# Patient Record
Sex: Female | Born: 1955 | Race: White | Hispanic: No | Marital: Married | Smoking: Former smoker
Health system: Southern US, Community
[De-identification: ages and names within clinical notes are randomized; demographics above are authoritative.]

## PROBLEM LIST (undated history)

## (undated) DIAGNOSIS — R0602 Shortness of breath: Secondary | ICD-10-CM

## (undated) DIAGNOSIS — K5900 Constipation, unspecified: Secondary | ICD-10-CM

## (undated) DIAGNOSIS — K5790 Diverticulosis of intestine, part unspecified, without perforation or abscess without bleeding: Secondary | ICD-10-CM

## (undated) DIAGNOSIS — R4189 Other symptoms and signs involving cognitive functions and awareness: Secondary | ICD-10-CM

## (undated) DIAGNOSIS — Z87898 Personal history of other specified conditions: Secondary | ICD-10-CM

## (undated) DIAGNOSIS — R5383 Other fatigue: Secondary | ICD-10-CM

## (undated) DIAGNOSIS — L749 Eccrine sweat disorder, unspecified: Secondary | ICD-10-CM

## (undated) DIAGNOSIS — I340 Nonrheumatic mitral (valve) insufficiency: Secondary | ICD-10-CM

## (undated) DIAGNOSIS — K5909 Other constipation: Secondary | ICD-10-CM

## (undated) DIAGNOSIS — F988 Other specified behavioral and emotional disorders with onset usually occurring in childhood and adolescence: Secondary | ICD-10-CM

## (undated) DIAGNOSIS — I351 Nonrheumatic aortic (valve) insufficiency: Secondary | ICD-10-CM

## (undated) DIAGNOSIS — R911 Solitary pulmonary nodule: Secondary | ICD-10-CM

## (undated) DIAGNOSIS — C4491 Basal cell carcinoma of skin, unspecified: Secondary | ICD-10-CM

## (undated) DIAGNOSIS — E039 Hypothyroidism, unspecified: Secondary | ICD-10-CM

## (undated) DIAGNOSIS — F909 Attention-deficit hyperactivity disorder, unspecified type: Secondary | ICD-10-CM

## (undated) DIAGNOSIS — E079 Disorder of thyroid, unspecified: Secondary | ICD-10-CM

## (undated) DIAGNOSIS — H409 Unspecified glaucoma: Secondary | ICD-10-CM

## (undated) DIAGNOSIS — J45909 Unspecified asthma, uncomplicated: Secondary | ICD-10-CM

## (undated) DIAGNOSIS — J302 Other seasonal allergic rhinitis: Secondary | ICD-10-CM

## (undated) DIAGNOSIS — I700 Atherosclerosis of aorta: Secondary | ICD-10-CM

## (undated) DIAGNOSIS — T7840XA Allergy, unspecified, initial encounter: Secondary | ICD-10-CM

## (undated) DIAGNOSIS — K635 Polyp of colon: Secondary | ICD-10-CM

## (undated) DIAGNOSIS — Z860101 Personal history of adenomatous and serrated colon polyps: Secondary | ICD-10-CM

## (undated) DIAGNOSIS — N200 Calculus of kidney: Secondary | ICD-10-CM

## (undated) DIAGNOSIS — M1990 Unspecified osteoarthritis, unspecified site: Secondary | ICD-10-CM

## (undated) HISTORY — DX: Shortness of breath: R06.02

## (undated) HISTORY — PX: OOPHORECTOMY: SHX86

## (undated) HISTORY — DX: Other constipation: K59.09

## (undated) HISTORY — DX: Basal cell carcinoma of skin, unspecified: C44.91

## (undated) HISTORY — PX: BASAL CELL CARCINOMA EXCISION: SHX1214

## (undated) HISTORY — DX: Unspecified osteoarthritis, unspecified site: M19.90

## (undated) HISTORY — DX: Nonrheumatic aortic (valve) insufficiency: I35.1

## (undated) HISTORY — DX: Polyp of colon: K63.5

## (undated) HISTORY — DX: Unspecified asthma, uncomplicated: J45.909

## (undated) HISTORY — DX: Eccrine sweat disorder, unspecified: L74.9

## (undated) HISTORY — DX: Attention-deficit hyperactivity disorder, unspecified type: F90.9

## (undated) HISTORY — DX: Calculus of kidney: N20.0

## (undated) HISTORY — DX: Other specified behavioral and emotional disorders with onset usually occurring in childhood and adolescence: F98.8

## (undated) HISTORY — PX: REPLACEMENT TOTAL KNEE: SUR1224

## (undated) HISTORY — DX: Personal history of other specified conditions: Z87.898

## (undated) HISTORY — DX: Other fatigue: R53.83

## (undated) HISTORY — DX: Diverticulosis of intestine, part unspecified, without perforation or abscess without bleeding: K57.90

## (undated) HISTORY — DX: Other seasonal allergic rhinitis: J30.2

## (undated) HISTORY — DX: Nonrheumatic mitral (valve) insufficiency: I34.0

## (undated) HISTORY — DX: Disorder of thyroid, unspecified: E07.9

## (undated) HISTORY — DX: Allergy, unspecified, initial encounter: T78.40XA

## (undated) HISTORY — DX: Other symptoms and signs involving cognitive functions and awareness: R41.89

## (undated) HISTORY — DX: Atherosclerosis of aorta: I70.0

## (undated) HISTORY — DX: Constipation, unspecified: K59.00

## (undated) HISTORY — DX: Solitary pulmonary nodule: R91.1

## (undated) HISTORY — DX: Unspecified glaucoma: H40.9

## (undated) HISTORY — DX: Hypothyroidism, unspecified: E03.9

## (undated) HISTORY — DX: Personal history of adenomatous and serrated colon polyps: Z86.0101

## (undated) HISTORY — PX: PELVIC LAPAROSCOPY: SHX162

## (undated) HISTORY — PX: KNEE SURGERY: SHX244

---

## 1994-09-19 HISTORY — PX: VAGINAL HYSTERECTOMY: SUR661

## 1998-02-27 ENCOUNTER — Other Ambulatory Visit: Admission: RE | Admit: 1998-02-27 | Discharge: 1998-02-27 | Payer: Self-pay | Admitting: Obstetrics and Gynecology

## 1999-02-16 ENCOUNTER — Encounter: Payer: Self-pay | Admitting: Obstetrics and Gynecology

## 1999-02-16 ENCOUNTER — Ambulatory Visit (HOSPITAL_COMMUNITY): Admission: RE | Admit: 1999-02-16 | Discharge: 1999-02-16 | Payer: Self-pay | Admitting: Family Medicine

## 1999-03-31 ENCOUNTER — Other Ambulatory Visit: Admission: RE | Admit: 1999-03-31 | Discharge: 1999-03-31 | Payer: Self-pay | Admitting: Obstetrics and Gynecology

## 2000-03-31 ENCOUNTER — Other Ambulatory Visit: Admission: RE | Admit: 2000-03-31 | Discharge: 2000-03-31 | Payer: Self-pay | Admitting: Obstetrics and Gynecology

## 2000-08-14 ENCOUNTER — Ambulatory Visit (HOSPITAL_COMMUNITY): Admission: RE | Admit: 2000-08-14 | Discharge: 2000-08-14 | Payer: Self-pay | Admitting: Obstetrics and Gynecology

## 2000-08-14 ENCOUNTER — Encounter: Payer: Self-pay | Admitting: Obstetrics and Gynecology

## 2001-04-09 ENCOUNTER — Other Ambulatory Visit: Admission: RE | Admit: 2001-04-09 | Discharge: 2001-04-09 | Payer: Self-pay | Admitting: Obstetrics and Gynecology

## 2001-10-06 ENCOUNTER — Emergency Department (HOSPITAL_COMMUNITY): Admission: EM | Admit: 2001-10-06 | Discharge: 2001-10-06 | Payer: Self-pay

## 2002-04-11 ENCOUNTER — Other Ambulatory Visit: Admission: RE | Admit: 2002-04-11 | Discharge: 2002-04-11 | Payer: Self-pay | Admitting: Obstetrics and Gynecology

## 2002-11-21 ENCOUNTER — Encounter: Admission: RE | Admit: 2002-11-21 | Discharge: 2002-11-21 | Payer: Self-pay | Admitting: Family Medicine

## 2002-11-21 ENCOUNTER — Encounter: Payer: Self-pay | Admitting: Family Medicine

## 2003-01-22 ENCOUNTER — Encounter: Admission: RE | Admit: 2003-01-22 | Discharge: 2003-01-22 | Payer: Self-pay | Admitting: Otolaryngology

## 2003-01-22 ENCOUNTER — Encounter: Payer: Self-pay | Admitting: Otolaryngology

## 2003-04-17 ENCOUNTER — Ambulatory Visit (HOSPITAL_COMMUNITY): Admission: RE | Admit: 2003-04-17 | Discharge: 2003-04-17 | Payer: Self-pay | Admitting: Obstetrics and Gynecology

## 2003-04-17 ENCOUNTER — Encounter: Payer: Self-pay | Admitting: Obstetrics and Gynecology

## 2004-04-14 ENCOUNTER — Other Ambulatory Visit: Admission: RE | Admit: 2004-04-14 | Discharge: 2004-04-14 | Payer: Self-pay | Admitting: Obstetrics and Gynecology

## 2004-09-19 HISTORY — PX: OTHER SURGICAL HISTORY: SHX169

## 2004-11-29 ENCOUNTER — Ambulatory Visit (HOSPITAL_COMMUNITY): Admission: RE | Admit: 2004-11-29 | Discharge: 2004-11-29 | Payer: Self-pay | Admitting: Obstetrics and Gynecology

## 2005-04-19 ENCOUNTER — Other Ambulatory Visit: Admission: RE | Admit: 2005-04-19 | Discharge: 2005-04-19 | Payer: Self-pay | Admitting: Obstetrics and Gynecology

## 2005-08-08 ENCOUNTER — Ambulatory Visit (HOSPITAL_COMMUNITY): Admission: RE | Admit: 2005-08-08 | Discharge: 2005-08-08 | Payer: Self-pay | Admitting: Otolaryngology

## 2006-01-06 ENCOUNTER — Ambulatory Visit: Payer: Self-pay | Admitting: Internal Medicine

## 2006-01-31 ENCOUNTER — Encounter (INDEPENDENT_AMBULATORY_CARE_PROVIDER_SITE_OTHER): Payer: Self-pay | Admitting: Specialist

## 2006-01-31 ENCOUNTER — Ambulatory Visit: Payer: Self-pay | Admitting: Internal Medicine

## 2006-04-25 ENCOUNTER — Other Ambulatory Visit: Admission: RE | Admit: 2006-04-25 | Discharge: 2006-04-25 | Payer: Self-pay | Admitting: Obstetrics and Gynecology

## 2007-04-05 ENCOUNTER — Ambulatory Visit (HOSPITAL_COMMUNITY): Admission: RE | Admit: 2007-04-05 | Discharge: 2007-04-05 | Payer: Self-pay | Admitting: Obstetrics and Gynecology

## 2007-05-01 ENCOUNTER — Other Ambulatory Visit: Admission: RE | Admit: 2007-05-01 | Discharge: 2007-05-01 | Payer: Self-pay | Admitting: Obstetrics and Gynecology

## 2008-01-29 ENCOUNTER — Encounter: Payer: Self-pay | Admitting: Internal Medicine

## 2008-01-29 ENCOUNTER — Telehealth: Payer: Self-pay | Admitting: Internal Medicine

## 2008-01-31 DIAGNOSIS — D126 Benign neoplasm of colon, unspecified: Secondary | ICD-10-CM

## 2008-01-31 DIAGNOSIS — E039 Hypothyroidism, unspecified: Secondary | ICD-10-CM | POA: Insufficient documentation

## 2008-01-31 DIAGNOSIS — F411 Generalized anxiety disorder: Secondary | ICD-10-CM | POA: Insufficient documentation

## 2008-02-01 ENCOUNTER — Ambulatory Visit: Payer: Self-pay | Admitting: Cardiology

## 2008-02-01 ENCOUNTER — Ambulatory Visit: Payer: Self-pay | Admitting: Internal Medicine

## 2008-02-01 DIAGNOSIS — N809 Endometriosis, unspecified: Secondary | ICD-10-CM | POA: Insufficient documentation

## 2008-02-01 DIAGNOSIS — K5010 Crohn's disease of large intestine without complications: Secondary | ICD-10-CM | POA: Insufficient documentation

## 2008-02-01 DIAGNOSIS — R1031 Right lower quadrant pain: Secondary | ICD-10-CM

## 2008-02-04 ENCOUNTER — Telehealth: Payer: Self-pay | Admitting: Internal Medicine

## 2008-05-01 ENCOUNTER — Other Ambulatory Visit: Admission: RE | Admit: 2008-05-01 | Discharge: 2008-05-01 | Payer: Self-pay | Admitting: Obstetrics and Gynecology

## 2009-04-22 ENCOUNTER — Ambulatory Visit (HOSPITAL_COMMUNITY): Admission: RE | Admit: 2009-04-22 | Discharge: 2009-04-22 | Payer: Self-pay | Admitting: Obstetrics and Gynecology

## 2009-05-04 ENCOUNTER — Encounter: Payer: Self-pay | Admitting: Obstetrics and Gynecology

## 2009-05-04 ENCOUNTER — Other Ambulatory Visit: Admission: RE | Admit: 2009-05-04 | Discharge: 2009-05-04 | Payer: Self-pay | Admitting: Obstetrics and Gynecology

## 2009-05-04 ENCOUNTER — Ambulatory Visit: Payer: Self-pay | Admitting: Obstetrics and Gynecology

## 2009-10-05 ENCOUNTER — Ambulatory Visit: Payer: Self-pay | Admitting: Obstetrics and Gynecology

## 2010-05-12 ENCOUNTER — Ambulatory Visit: Payer: Self-pay | Admitting: Obstetrics and Gynecology

## 2010-12-08 ENCOUNTER — Ambulatory Visit (INDEPENDENT_AMBULATORY_CARE_PROVIDER_SITE_OTHER): Payer: BC Managed Care – PPO | Admitting: Obstetrics and Gynecology

## 2010-12-08 DIAGNOSIS — R635 Abnormal weight gain: Secondary | ICD-10-CM

## 2010-12-08 DIAGNOSIS — L680 Hirsutism: Secondary | ICD-10-CM

## 2011-02-02 ENCOUNTER — Other Ambulatory Visit: Payer: Self-pay | Admitting: Obstetrics and Gynecology

## 2011-02-02 DIAGNOSIS — Z1231 Encounter for screening mammogram for malignant neoplasm of breast: Secondary | ICD-10-CM

## 2011-02-08 ENCOUNTER — Encounter: Payer: Self-pay | Admitting: Internal Medicine

## 2011-02-16 ENCOUNTER — Ambulatory Visit: Payer: BC Managed Care – PPO | Admitting: Obstetrics and Gynecology

## 2011-02-16 ENCOUNTER — Ambulatory Visit (HOSPITAL_COMMUNITY)
Admission: RE | Admit: 2011-02-16 | Discharge: 2011-02-16 | Disposition: A | Discharge status: Home / Self Care | Payer: BC Managed Care – PPO | Source: Ambulatory Visit | Attending: Obstetrics and Gynecology | Admitting: Obstetrics and Gynecology

## 2011-02-16 DIAGNOSIS — Z1231 Encounter for screening mammogram for malignant neoplasm of breast: Secondary | ICD-10-CM | POA: Insufficient documentation

## 2011-03-01 ENCOUNTER — Ambulatory Visit (INDEPENDENT_AMBULATORY_CARE_PROVIDER_SITE_OTHER): Payer: BC Managed Care – PPO | Admitting: Obstetrics and Gynecology

## 2011-03-01 DIAGNOSIS — N898 Other specified noninflammatory disorders of vagina: Secondary | ICD-10-CM

## 2011-03-01 DIAGNOSIS — B373 Candidiasis of vulva and vagina: Secondary | ICD-10-CM

## 2011-03-01 DIAGNOSIS — E039 Hypothyroidism, unspecified: Secondary | ICD-10-CM

## 2011-04-11 ENCOUNTER — Other Ambulatory Visit: Payer: Self-pay | Admitting: *Deleted

## 2011-04-11 ENCOUNTER — Other Ambulatory Visit: Payer: Self-pay | Admitting: Sports Medicine

## 2011-04-11 DIAGNOSIS — M7120 Synovial cyst of popliteal space [Baker], unspecified knee: Secondary | ICD-10-CM

## 2011-04-11 MED ORDER — THYROID 60 MG PO TABS: 60.0000 mg | ORAL_TABLET | Freq: Every day | ORAL | Status: DC

## 2011-04-11 NOTE — Telephone Encounter (Signed)
Received refill fax.  Please authorize refills. Will be e-scribed.

## 2011-04-12 ENCOUNTER — Other Ambulatory Visit: Payer: Self-pay | Admitting: Sports Medicine

## 2011-04-12 ENCOUNTER — Ambulatory Visit
Admission: RE | Admit: 2011-04-12 | Discharge: 2011-04-12 | Disposition: A | Discharge status: Home / Self Care | Payer: BC Managed Care – PPO | Source: Ambulatory Visit | Attending: Sports Medicine | Admitting: Sports Medicine

## 2011-04-12 DIAGNOSIS — M7120 Synovial cyst of popliteal space [Baker], unspecified knee: Secondary | ICD-10-CM

## 2011-04-27 ENCOUNTER — Other Ambulatory Visit: Payer: Self-pay | Admitting: Dermatology

## 2011-04-29 ENCOUNTER — Encounter: Payer: Self-pay | Admitting: Gynecology

## 2011-05-15 ENCOUNTER — Other Ambulatory Visit: Payer: Self-pay | Admitting: Obstetrics and Gynecology

## 2011-05-16 ENCOUNTER — Other Ambulatory Visit (HOSPITAL_COMMUNITY)
Admission: RE | Admit: 2011-05-16 | Discharge: 2011-05-16 | Disposition: A | Discharge status: Home / Self Care | Payer: BC Managed Care – PPO | Source: Ambulatory Visit | Attending: Obstetrics and Gynecology | Admitting: Obstetrics and Gynecology

## 2011-05-16 ENCOUNTER — Ambulatory Visit (INDEPENDENT_AMBULATORY_CARE_PROVIDER_SITE_OTHER): Payer: BC Managed Care – PPO | Admitting: Obstetrics and Gynecology

## 2011-05-16 ENCOUNTER — Encounter: Payer: Self-pay | Admitting: Obstetrics and Gynecology

## 2011-05-16 VITALS — BP 116/74 | Ht 66.0 in | Wt 162.0 lb

## 2011-05-16 DIAGNOSIS — B373 Candidiasis of vulva and vagina: Secondary | ICD-10-CM

## 2011-05-16 DIAGNOSIS — Z01419 Encounter for gynecological examination (general) (routine) without abnormal findings: Secondary | ICD-10-CM | POA: Insufficient documentation

## 2011-05-16 DIAGNOSIS — E039 Hypothyroidism, unspecified: Secondary | ICD-10-CM

## 2011-05-16 DIAGNOSIS — N951 Menopausal and female climacteric states: Secondary | ICD-10-CM

## 2011-05-16 DIAGNOSIS — Z780 Asymptomatic menopausal state: Secondary | ICD-10-CM

## 2011-05-16 DIAGNOSIS — N898 Other specified noninflammatory disorders of vagina: Secondary | ICD-10-CM

## 2011-05-16 MED ORDER — THYROID 90 MG PO TABS: 90.0000 mg | ORAL_TABLET | Freq: Every day | ORAL | Status: DC

## 2011-05-16 MED ORDER — ESTRADIOL 0.05 MG/24HR TD PTWK: 1.0000 | MEDICATED_PATCH | TRANSDERMAL | Status: DC

## 2011-05-16 MED ORDER — FLUCONAZOLE 200 MG PO TABS: 200.0000 mg | ORAL_TABLET | Freq: Every day | ORAL | Status: DC

## 2011-05-16 NOTE — Progress Notes (Signed)
Patient came to see me today for her annual GYN exam. She is doing very well on her patch even though we lowered her dose. She's also doing much better on Armour thyroid than she did on Synthroid. She is up-to-date on mammograms. She never had a bone density because her insurance will not pay. She has however lost some height since I last saw her. She is still having trouble with weight and brittlel nails and thinning hair. Her TSH in June was 3. She was previously bothered by yeast vaginitis. She responded well to a 7 day course of Diflucan. She thinks it's reoccurred.  HEENT: Within normal limits. Neck: No masses. Supraclavicular lymph nodes: Not enlarged. Breasts: Examined in both sitting and lying position. Symmetrical without skin changes or masses. Abdomen: Soft no masses guarding or rebound. No hernias. Pelvic: External within normal limits. BUS within normal limits. Vaginal examination shows good estrogen effect, no cystocele enterocele or rectocele. Positive KOH. Cervix and uterus absent. Adnexa within normal limits. Rectovaginal confirmatory. Extremities within normal limits.   Assessment: Hypothyroidism, menopausal symptoms, yeast vaginitis.  Plan: 1. Increased Armour Thyroid 90 mg daily. We check TSH in 8 weeks. 2 Diflucan 200 mg daily for 7 days 3. Continue on the 0.05 mg estrogen patch. 4. Patient to ccall her insurance company and tell them she's lost height and get approval for bone density to be done here.

## 2011-07-21 ENCOUNTER — Other Ambulatory Visit (INDEPENDENT_AMBULATORY_CARE_PROVIDER_SITE_OTHER): Payer: BC Managed Care – PPO | Admitting: *Deleted

## 2011-07-21 ENCOUNTER — Other Ambulatory Visit: Payer: Self-pay | Admitting: Obstetrics and Gynecology

## 2011-07-21 ENCOUNTER — Encounter (INDEPENDENT_AMBULATORY_CARE_PROVIDER_SITE_OTHER): Payer: BC Managed Care – PPO

## 2011-07-21 DIAGNOSIS — E039 Hypothyroidism, unspecified: Secondary | ICD-10-CM

## 2011-07-21 DIAGNOSIS — Z13820 Encounter for screening for osteoporosis: Secondary | ICD-10-CM

## 2011-09-06 ENCOUNTER — Other Ambulatory Visit: Payer: Self-pay | Admitting: Obstetrics and Gynecology

## 2011-09-11 ENCOUNTER — Other Ambulatory Visit: Payer: Self-pay | Admitting: Obstetrics and Gynecology

## 2012-05-16 ENCOUNTER — Encounter: Payer: Self-pay | Admitting: Obstetrics and Gynecology

## 2012-05-16 ENCOUNTER — Ambulatory Visit (INDEPENDENT_AMBULATORY_CARE_PROVIDER_SITE_OTHER): Payer: BC Managed Care – PPO | Admitting: Obstetrics and Gynecology

## 2012-05-16 VITALS — BP 124/78 | Ht 66.0 in | Wt 153.0 lb

## 2012-05-16 DIAGNOSIS — E039 Hypothyroidism, unspecified: Secondary | ICD-10-CM

## 2012-05-16 DIAGNOSIS — R87619 Unspecified abnormal cytological findings in specimens from cervix uteri: Secondary | ICD-10-CM

## 2012-05-16 DIAGNOSIS — Z01419 Encounter for gynecological examination (general) (routine) without abnormal findings: Secondary | ICD-10-CM

## 2012-05-16 DIAGNOSIS — Z833 Family history of diabetes mellitus: Secondary | ICD-10-CM

## 2012-05-16 DIAGNOSIS — H409 Unspecified glaucoma: Secondary | ICD-10-CM | POA: Insufficient documentation

## 2012-05-16 DIAGNOSIS — N898 Other specified noninflammatory disorders of vagina: Secondary | ICD-10-CM

## 2012-05-16 LAB — LIPID PANEL
Cholesterol: 174 mg/dL (ref 0–200)
LDL Cholesterol: 92 mg/dL (ref 0–99)
VLDL: 13 mg/dL (ref 0–40)

## 2012-05-16 LAB — CBC WITH DIFFERENTIAL/PLATELET
Basophils Absolute: 0.1 10*3/uL (ref 0.0–0.1)
Lymphocytes Relative: 46 % (ref 12–46)
Neutro Abs: 1.9 10*3/uL (ref 1.7–7.7)
Neutrophils Relative %: 42 % — ABNORMAL LOW (ref 43–77)
Platelets: 270 10*3/uL (ref 150–400)
RDW: 12.9 % (ref 11.5–15.5)
WBC: 4.7 10*3/uL (ref 4.0–10.5)

## 2012-05-16 LAB — HEMOGLOBIN A1C
Hgb A1c MFr Bld: 5.5 % (ref <5.7)
Mean Plasma Glucose: 111 mg/dL (ref <117)

## 2012-05-16 LAB — WET PREP FOR TRICH, YEAST, CLUE: Trich, Wet Prep: NONE SEEN

## 2012-05-16 MED ORDER — FLUCONAZOLE 200 MG PO TABS: 200.0000 mg | ORAL_TABLET | Freq: Every day | ORAL | Status: AC

## 2012-05-16 MED ORDER — LORAZEPAM 0.5 MG PO TABS: 0.5000 mg | ORAL_TABLET | ORAL | Status: DC | PRN

## 2012-05-16 MED ORDER — THYROID 90 MG PO TABS: 90.0000 mg | ORAL_TABLET | Freq: Every day | ORAL | Status: DC

## 2012-05-16 MED ORDER — CLIMARA 0.05 MG/24HR TD PTWK: 1.0000 | MEDICATED_PATCH | TRANSDERMAL | Status: DC

## 2012-05-16 NOTE — Progress Notes (Signed)
Patient came to see me today for her annual GYN exam. She thinks she has a vaginal yeast infection. She does not get discharge and itching usually but gets lower abdominal discomfort which she is currently having. She tried a one-day Monistat without success. She usually does well with Diflucan. She is having no vaginal bleeding. She is having no pelvic pain. She has been doing mammograms every other year and had a normal 1 in 2012. She had a normal bone density in 2012 as well. She has significant menopausal symptoms that respond well to her Climara patch. We are also treating her with Armour Thyroid 90 mg daily for hypothyroidism. She is doing well on it with no thyroid symptoms. She had a LAVH, BSO in 1996 for endometriosis. She has always had normal Pap smears. Her last Pap smear was 2012.  HEENT: Within normal limits. Kennon Portela present. Neck: No masses. Supraclavicular lymph nodes: Not enlarged. Breasts: Examined in both sitting and lying position. Symmetrical without skin changes or masses. Abdomen: Soft no masses guarding or rebound. No hernias. Pelvic: External within normal limits. BUS within normal limits. Vaginal examination shows good estrogen effect, no cystocele enterocele or rectocele. Patient has a yeast like discharge. Wet prep was negative except for clue cells. Cervix and uterus absent. Adnexa within normal limits. Rectovaginal confirmatory. Extremities within normal limits.  Assessment: #1. Menopausal symptoms #2. Yeast vaginitis #3. Hypothyroidism  Plan: Encourage patient to go to yearly mammograms. Appropriate lab work done. Continue Climara patch. Continue Armour Thyroid. Based on symptoms and exam treated with Diflucan 200 mg daily for 7 days. Discussed oral refresh.The new Pap smear guidelines were discussed with the patient. No Pap done.

## 2012-05-16 NOTE — Patient Instructions (Signed)
Call if lower abdominal discomfort continues.

## 2012-05-17 LAB — URINALYSIS W MICROSCOPIC + REFLEX CULTURE
Glucose, UA: NEGATIVE mg/dL
Hgb urine dipstick: NEGATIVE
Leukocytes, UA: NEGATIVE
Nitrite: NEGATIVE
Protein, ur: NEGATIVE mg/dL
pH: 7 (ref 5.0–8.0)

## 2012-06-05 ENCOUNTER — Other Ambulatory Visit: Payer: Self-pay | Admitting: Dermatology

## 2012-06-14 ENCOUNTER — Encounter: Payer: Self-pay | Admitting: Internal Medicine

## 2012-07-05 ENCOUNTER — Other Ambulatory Visit: Payer: Self-pay | Admitting: Dermatology

## 2013-04-18 ENCOUNTER — Other Ambulatory Visit: Payer: Self-pay | Admitting: Gynecology

## 2013-04-18 DIAGNOSIS — Z1231 Encounter for screening mammogram for malignant neoplasm of breast: Secondary | ICD-10-CM

## 2013-04-23 ENCOUNTER — Ambulatory Visit (HOSPITAL_COMMUNITY)
Admission: RE | Admit: 2013-04-23 | Discharge: 2013-04-23 | Disposition: A | Discharge status: Home / Self Care | Payer: BC Managed Care – PPO | Source: Ambulatory Visit | Attending: Gynecology | Admitting: Gynecology

## 2013-04-23 DIAGNOSIS — Z1231 Encounter for screening mammogram for malignant neoplasm of breast: Secondary | ICD-10-CM | POA: Insufficient documentation

## 2013-05-16 ENCOUNTER — Other Ambulatory Visit: Payer: Self-pay | Admitting: Obstetrics and Gynecology

## 2013-05-22 ENCOUNTER — Ambulatory Visit (INDEPENDENT_AMBULATORY_CARE_PROVIDER_SITE_OTHER): Payer: BC Managed Care – PPO | Admitting: Gynecology

## 2013-05-22 ENCOUNTER — Encounter: Payer: Self-pay | Admitting: Gynecology

## 2013-05-22 VITALS — BP 110/70 | Ht 66.25 in | Wt 160.0 lb

## 2013-05-22 DIAGNOSIS — Z01419 Encounter for gynecological examination (general) (routine) without abnormal findings: Secondary | ICD-10-CM

## 2013-05-22 DIAGNOSIS — Z79890 Hormone replacement therapy (postmenopausal): Secondary | ICD-10-CM

## 2013-05-22 DIAGNOSIS — Z13220 Encounter for screening for lipoid disorders: Secondary | ICD-10-CM

## 2013-05-22 DIAGNOSIS — E039 Hypothyroidism, unspecified: Secondary | ICD-10-CM

## 2013-05-22 LAB — LIPID PANEL
HDL: 77 mg/dL (ref >39)
LDL Cholesterol: 113 mg/dL — ABNORMAL HIGH (ref 0–99)
Triglycerides: 97 mg/dL (ref <150)

## 2013-05-22 LAB — COMPREHENSIVE METABOLIC PANEL
ALT: 27 U/L (ref 0–35)
Albumin: 4.8 g/dL (ref 3.5–5.2)
Alkaline Phosphatase: 59 U/L (ref 39–117)
Glucose, Bld: 89 mg/dL (ref 70–99)
Potassium: 4.4 mEq/L (ref 3.5–5.3)
Sodium: 140 mEq/L (ref 135–145)
Total Protein: 7 g/dL (ref 6.0–8.3)

## 2013-05-22 LAB — CBC WITH DIFFERENTIAL/PLATELET
Basophils Relative: 1 % (ref 0–1)
Hemoglobin: 14.6 g/dL (ref 12.0–15.0)
Lymphs Abs: 2.3 10*3/uL (ref 0.7–4.0)
MCHC: 34.4 g/dL (ref 30.0–36.0)
Monocytes Relative: 8 % (ref 3–12)
Neutro Abs: 1.9 10*3/uL (ref 1.7–7.7)
Neutrophils Relative %: 39 % — ABNORMAL LOW (ref 43–77)
RBC: 4.61 MIL/uL (ref 3.87–5.11)

## 2013-05-22 LAB — TSH: TSH: 1.837 u[IU]/mL (ref 0.350–4.500)

## 2013-05-22 MED ORDER — CLIMARA 0.05 MG/24HR TD PTWK: MEDICATED_PATCH | TRANSDERMAL | Status: DC

## 2013-05-22 MED ORDER — THYROID 90 MG PO TABS: 90.0000 mg | ORAL_TABLET | Freq: Every day | ORAL | Status: DC

## 2013-05-22 MED ORDER — FLUCONAZOLE 200 MG PO TABS: 200.0000 mg | ORAL_TABLET | Freq: Every day | ORAL | Status: DC

## 2013-05-22 NOTE — Progress Notes (Signed)
Elaine Gray [birth year] [id number]        57 y.o.  G2P0020 for annual exam.  Former patient of Dr. Eda Paschal with several issues noted below.  Past medical history,surgical history, medications, allergies, family history and social history were all reviewed and documented in the EPIC chart.  ROS:  Performed and pertinent positives and negatives are included in the history, assessment and plan .  Exam: Sherrilyn Rist assistant Filed Vitals:   05/22/13 1008  BP: 110/70  Height: 5' 6.25" (1.683 m)  Weight: 160 lb (72.576 kg)   General appearance  Normal Skin grossly normal Head/Neck normal with no cervical or supraclavicular adenopathy thyroid normal Lungs  clear Cardiac RR, without RMG Abdominal  soft, nontender, without masses, organomegaly or hernia Breasts  examined lying and sitting without masses, retractions, discharge or axillary adenopathy. Pelvic  Ext/BUS/vagina  normal  Adnexa  Without masses or tenderness    Anus and perineum  normal   Rectovaginal  normal sphincter tone without palpated masses or tenderness.    Assessment/Plan:  57 y.o. G34P0020 female for annual exam.   1. HRT.  Patient on Climara 0.05 mg patch status post LAVH BSO 1996 for endometriosis. She is doing well with this and wants to continue. She has missed in the past and had significant hot flushes and night sweats.  I reviewed the whole issue of HRT with her to include the WHI study with increased risk of stroke, heart attack, DVT and breast cancer. The ACOG and NAMS statements for lowest dose for the shortest period of time reviewed. Transdermal versus oral first-pass effect benefit discussed.  The patient understands and accepts the risks and I refilled her Climara 0.05 mg patch x1 year.  2. Recurrent yeast vulvovaginitis. Patient has 3-4 yeast episodes per year. Responds well to Diflucan. Diflucan 200 mg #8 tabs provided to use when necessary this coming year. She has tried the 150 mg and sometimes needs to  have these and this is why I used the 200 mg dose. 3. Hypothyroid. On Armour thyroid 90 mg per Dr. Eda Paschal. Was initiated over 20 years ago by her primary physician. Will check TSH today and I refilled her times a year. She is having some difficulty losing weight and we discussed possibly increasing her dose depending on her TSH. 4. Pap smear 2012. No Pap smear done today. Reviewed current screening guidelines. She is status post hysterectomy for benign indications. Has no history of abnormal Pap smears previously. Options to stop screening altogether versus less frequent screening intervals reviewed. We'll readdress on an annual basis. 5. Mammography 04/2013. We'll continue annual mammography. SBE monthly reviewed. 6. DEXA 2012 normal. Repeat a 5 year interval. Increase calcium vitamin D reviewed. 7. Colonoscopy 2007. She received a notice saying she was due and I have asked her to call and schedule this. 8. Health maintenance. Baseline CBC comprehensive metabolic panel lipid profile urinalysis TSH and vitamin D ordered. Followup in one year, sooner as needed.    Note: This document was prepared with digital dictation and possible smart phrase technology. Any transcriptional errors that result from this process are unintentional.   Dara [name] MD, 10:51 AM 05/22/2013

## 2013-05-22 NOTE — Patient Instructions (Signed)
Follow up in one year, sooner as needed. 

## 2013-05-23 LAB — URINALYSIS W MICROSCOPIC + REFLEX CULTURE
Bacteria, UA: NONE SEEN
Casts: NONE SEEN
Glucose, UA: NEGATIVE mg/dL
Hgb urine dipstick: NEGATIVE
Ketones, ur: NEGATIVE mg/dL
pH: 7 (ref 5.0–8.0)

## 2013-10-04 ENCOUNTER — Other Ambulatory Visit: Payer: Self-pay | Admitting: Gynecology

## 2013-10-04 NOTE — Telephone Encounter (Signed)
At her 05/22/13 visit you wrote "Recurrent yeast vulvovaginitis. Patient has 3-4 yeast episodes per year. Responds well to Diflucan. Diflucan 200 mg #8 tabs provided to use when necessary this coming year. She has tried the 150 mg and sometimes needs to have these and this is why I used the 200 mg dose."

## 2014-03-20 ENCOUNTER — Other Ambulatory Visit: Payer: Self-pay | Admitting: Gynecology

## 2014-03-20 NOTE — Telephone Encounter (Signed)
At her 05/22/13  Yearly exam you wrote "Recurrent yeast vulvovaginitis. Patient has 3-4 yeast episodes per year. Responds well to Diflucan. Diflucan 200 mg #8 tabs provided to use when necessary this coming year. She has tried the 150 mg and sometimes needs to have these and this is why I used the 200 mg dose."

## 2014-05-15 ENCOUNTER — Other Ambulatory Visit: Payer: Self-pay | Admitting: Gynecology

## 2014-05-26 ENCOUNTER — Other Ambulatory Visit: Payer: Self-pay | Admitting: Gynecology

## 2014-06-10 ENCOUNTER — Other Ambulatory Visit: Payer: Self-pay | Admitting: Gynecology

## 2014-06-13 ENCOUNTER — Encounter: Payer: Self-pay | Admitting: Gynecology

## 2014-06-13 ENCOUNTER — Other Ambulatory Visit (HOSPITAL_COMMUNITY)
Admission: RE | Admit: 2014-06-13 | Discharge: 2014-06-13 | Disposition: A | Discharge status: Home / Self Care | Payer: BC Managed Care – PPO | Source: Ambulatory Visit | Attending: Gynecology | Admitting: Gynecology

## 2014-06-13 ENCOUNTER — Ambulatory Visit (INDEPENDENT_AMBULATORY_CARE_PROVIDER_SITE_OTHER): Payer: BC Managed Care – PPO | Admitting: Gynecology

## 2014-06-13 ENCOUNTER — Telehealth: Payer: Self-pay | Admitting: *Deleted

## 2014-06-13 VITALS — BP 120/74 | Ht 66.0 in | Wt 167.0 lb

## 2014-06-13 DIAGNOSIS — E039 Hypothyroidism, unspecified: Secondary | ICD-10-CM

## 2014-06-13 DIAGNOSIS — N898 Other specified noninflammatory disorders of vagina: Secondary | ICD-10-CM

## 2014-06-13 DIAGNOSIS — Z01419 Encounter for gynecological examination (general) (routine) without abnormal findings: Secondary | ICD-10-CM | POA: Insufficient documentation

## 2014-06-13 DIAGNOSIS — E8881 Metabolic syndrome: Secondary | ICD-10-CM

## 2014-06-13 DIAGNOSIS — Z79890 Hormone replacement therapy (postmenopausal): Secondary | ICD-10-CM

## 2014-06-13 DIAGNOSIS — N9489 Other specified conditions associated with female genital organs and menstrual cycle: Secondary | ICD-10-CM

## 2014-06-13 LAB — COMPREHENSIVE METABOLIC PANEL
ALBUMIN: 4.4 g/dL (ref 3.5–5.2)
ALT: 20 U/L (ref 0–35)
AST: 16 U/L (ref 0–37)
Alkaline Phosphatase: 58 U/L (ref 39–117)
BUN: 17 mg/dL (ref 6–23)
CO2: 19 meq/L (ref 19–32)
Calcium: 9.3 mg/dL (ref 8.4–10.5)
Chloride: 106 mEq/L (ref 96–112)
Creat: 0.82 mg/dL (ref 0.50–1.10)
Glucose, Bld: 92 mg/dL (ref 70–99)
POTASSIUM: 4.4 meq/L (ref 3.5–5.3)
SODIUM: 140 meq/L (ref 135–145)
TOTAL PROTEIN: 6.7 g/dL (ref 6.0–8.3)
Total Bilirubin: 0.5 mg/dL (ref 0.2–1.2)

## 2014-06-13 LAB — URINALYSIS W MICROSCOPIC + REFLEX CULTURE
Bacteria, UA: NONE SEEN
Bilirubin Urine: NEGATIVE
CRYSTALS: NONE SEEN
Casts: NONE SEEN
GLUCOSE, UA: NEGATIVE mg/dL
Hgb urine dipstick: NEGATIVE
Ketones, ur: NEGATIVE mg/dL
LEUKOCYTES UA: NEGATIVE
Nitrite: NEGATIVE
Protein, ur: NEGATIVE mg/dL
SPECIFIC GRAVITY, URINE: 1.013 (ref 1.005–1.030)
Urobilinogen, UA: 0.2 mg/dL (ref 0.0–1.0)
pH: 6.5 (ref 5.0–8.0)

## 2014-06-13 LAB — CBC WITH DIFFERENTIAL/PLATELET
Basophils Absolute: 0.1 10*3/uL (ref 0.0–0.1)
Basophils Relative: 1 % (ref 0–1)
Eosinophils Absolute: 0.1 10*3/uL (ref 0.0–0.7)
Eosinophils Relative: 2 % (ref 0–5)
HCT: 40.5 % (ref 36.0–46.0)
HEMOGLOBIN: 14.2 g/dL (ref 12.0–15.0)
LYMPHS ABS: 2.2 10*3/uL (ref 0.7–4.0)
Lymphocytes Relative: 41 % (ref 12–46)
MCH: 31.6 pg (ref 26.0–34.0)
MCHC: 35.1 g/dL (ref 30.0–36.0)
MCV: 90.2 fL (ref 78.0–100.0)
Monocytes Absolute: 0.6 10*3/uL (ref 0.1–1.0)
Monocytes Relative: 12 % (ref 3–12)
NEUTROS ABS: 2.3 10*3/uL (ref 1.7–7.7)
NEUTROS PCT: 44 % (ref 43–77)
Platelets: 273 10*3/uL (ref 150–400)
RBC: 4.49 MIL/uL (ref 3.87–5.11)
RDW: 13.1 % (ref 11.5–15.5)
WBC: 5.3 10*3/uL (ref 4.0–10.5)

## 2014-06-13 LAB — WET PREP FOR TRICH, YEAST, CLUE
Clue Cells Wet Prep HPF POC: NONE SEEN
Trich, Wet Prep: NONE SEEN
Yeast Wet Prep HPF POC: NONE SEEN

## 2014-06-13 LAB — LIPID PANEL
CHOLESTEROL: 179 mg/dL (ref 0–200)
HDL: 72 mg/dL (ref >39)
LDL Cholesterol: 92 mg/dL (ref 0–99)
TRIGLYCERIDES: 73 mg/dL (ref <150)
Total CHOL/HDL Ratio: 2.5 Ratio
VLDL: 15 mg/dL (ref 0–40)

## 2014-06-13 LAB — TSH: TSH: 1.677 u[IU]/mL (ref 0.350–4.500)

## 2014-06-13 MED ORDER — CLIMARA 0.05 MG/24HR TD PTWK: MEDICATED_PATCH | TRANSDERMAL | Status: DC

## 2014-06-13 NOTE — Patient Instructions (Signed)
Office will help you make arrangements for an endocrinology appointment.  You may obtain a copy of any labs that were done today by logging onto MyChart as outlined in the instructions provided with your AVS (after visit summary). The office will not call with normal lab results but certainly if there are any significant abnormalities then we will contact you.   Health Maintenance, Female A healthy lifestyle and preventative care can promote health and wellness.  Maintain regular health, dental, and eye exams.  Eat a healthy diet. Foods like vegetables, fruits, whole grains, low-fat dairy products, and lean protein foods contain the nutrients you need without too many calories. Decrease your intake of foods high in solid fats, added sugars, and salt. Get information about a proper diet from your caregiver, if necessary.  Regular physical exercise is one of the most important things you can do for your health. Most adults should get at least 150 minutes of moderate-intensity exercise (any activity that increases your heart rate and causes you to sweat) each week. In addition, most adults need muscle-strengthening exercises on 2 or more days a week.   Maintain a healthy weight. The body mass index (BMI) is a screening tool to identify possible weight problems. It provides an estimate of body fat based on height and weight. Your caregiver can help determine your BMI, and can help you achieve or maintain a healthy weight. For adults 20 years and older:  A BMI below 18.5 is considered underweight.  A BMI of 18.5 to 24.9 is normal.  A BMI of 25 to 29.9 is considered overweight.  A BMI of 30 and above is considered obese.  Maintain normal blood lipids and cholesterol by exercising and minimizing your intake of saturated fat. Eat a balanced diet with plenty of fruits and vegetables. Blood tests for lipids and cholesterol should begin at age 59 and be repeated every 5 years. If your lipid or  cholesterol levels are high, you are over 50, or you are a high risk for heart disease, you may need your cholesterol levels checked more frequently.Ongoing high lipid and cholesterol levels should be treated with medicines if diet and exercise are not effective.  If you smoke, find out from your caregiver how to quit. If you do not use tobacco, do not start.  Lung cancer screening is recommended for adults aged 106 80 years who are at high risk for developing lung cancer because of a history of smoking. Yearly low-dose computed tomography (CT) is recommended for people who have at least a 30-pack-year history of smoking and are a current smoker or have quit within the past 15 years. A pack year of smoking is smoking an average of 1 pack of cigarettes a day for 1 year (for example: 1 pack a day for 30 years or 2 packs a day for 15 years). Yearly screening should continue until the smoker has stopped smoking for at least 15 years. Yearly screening should also be stopped for people who develop a health problem that would prevent them from having lung cancer treatment.  If you are pregnant, do not drink alcohol. If you are breastfeeding, be very cautious about drinking alcohol. If you are not pregnant and choose to drink alcohol, do not exceed 1 drink per day. One drink is considered to be 12 ounces (355 mL) of beer, 5 ounces (148 mL) of wine, or 1.5 ounces (44 mL) of liquor.  Avoid use of street drugs. Do not share needles with anyone.  Ask for help if you need support or instructions about stopping the use of drugs.  High blood pressure causes heart disease and increases the risk of stroke. Blood pressure should be checked at least every 1 to 2 years. Ongoing high blood pressure should be treated with medicines, if weight loss and exercise are not effective.  If you are 10 to 60 years old, ask your caregiver if you should take aspirin to prevent strokes.  Diabetes screening involves taking a blood sample  to check your fasting blood sugar level. This should be done once every 3 years, after age 58, if you are within normal weight and without risk factors for diabetes. Testing should be considered at a younger age or be carried out more frequently if you are overweight and have at least 1 risk factor for diabetes.  Breast cancer screening is essential preventative care for women. You should practice "breast self-awareness." This means understanding the normal appearance and feel of your breasts and may include breast self-examination. Any changes detected, no matter how small, should be reported to a caregiver. Women in their 46s and 30s should have a clinical breast exam (CBE) by a caregiver as part of a regular health exam every 1 to 3 years. After age 55, women should have a CBE every year. Starting at age 71, women should consider having a mammogram (breast X-ray) every year. Women who have a family history of breast cancer should talk to their caregiver about genetic screening. Women at a high risk of breast cancer should talk to their caregiver about having an MRI and a mammogram every year.  Breast cancer gene (BRCA)-related cancer risk assessment is recommended for women who have family members with BRCA-related cancers. BRCA-related cancers include breast, ovarian, tubal, and peritoneal cancers. Having family members with these cancers may be associated with an increased risk for harmful changes (mutations) in the breast cancer genes BRCA1 and BRCA2. Results of the assessment will determine the need for genetic counseling and BRCA1 and BRCA2 testing.  The Pap test is a screening test for cervical cancer. Women should have a Pap test starting at age 72. Between ages 42 and 71, Pap tests should be repeated every 2 years. Beginning at age 63, you should have a Pap test every 3 years as long as the past 3 Pap tests have been normal. If you had a hysterectomy for a problem that was not cancer or a condition  that could lead to cancer, then you no longer need Pap tests. If you are between ages 57 and 18, and you have had normal Pap tests going back 10 years, you no longer need Pap tests. If you have had past treatment for cervical cancer or a condition that could lead to cancer, you need Pap tests and screening for cancer for at least 20 years after your treatment. If Pap tests have been discontinued, risk factors (such as a new sexual partner) need to be reassessed to determine if screening should be resumed. Some women have medical problems that increase the chance of getting cervical cancer. In these cases, your caregiver may recommend more frequent screening and Pap tests.  The human papillomavirus (HPV) test is an additional test that may be used for cervical cancer screening. The HPV test looks for the virus that can cause the cell changes on the cervix. The cells collected during the Pap test can be tested for HPV. The HPV test could be used to screen women aged 55 years  and older, and should be used in women of any age who have unclear Pap test results. After the age of 30, women should have HPV testing at the same frequency as a Pap test.  Colorectal cancer can be detected and often prevented. Most routine colorectal cancer screening begins at the age of 77 and continues through age 49. However, your caregiver may recommend screening at an earlier age if you have risk factors for colon cancer. On a yearly basis, your caregiver may provide home test kits to check for hidden blood in the stool. Use of a small camera at the end of a tube, to directly examine the colon (sigmoidoscopy or colonoscopy), can detect the earliest forms of colorectal cancer. Talk to your caregiver about this at age 45, when routine screening begins. Direct examination of the colon should be repeated every 5 to 10 years through age 28, unless early forms of pre-cancerous polyps or small growths are found.  Hepatitis C blood testing is  recommended for all people born from [birth year] through 1967 and any individual with known risks for hepatitis C.  Practice safe sex. Use condoms and avoid high-risk sexual practices to reduce the spread of sexually transmitted infections (STIs). Sexually active women aged 73 and younger should be checked for Chlamydia, which is a common sexually transmitted infection. Older women with new or multiple partners should also be tested for Chlamydia. Testing for other STIs is recommended if you are sexually active and at increased risk.  Osteoporosis is a disease in which the bones lose minerals and strength with aging. This can result in serious bone fractures. The risk of osteoporosis can be identified using a bone density scan. Women ages 71 and over and women at risk for fractures or osteoporosis should discuss screening with their caregivers. Ask your caregiver whether you should be taking a calcium supplement or vitamin D to reduce the rate of osteoporosis.  Menopause can be associated with physical symptoms and risks. Hormone replacement therapy is available to decrease symptoms and risks. You should talk to your caregiver about whether hormone replacement therapy is right for you.  Use sunscreen. Apply sunscreen liberally and repeatedly throughout the day. You should seek shade when your shadow is shorter than you. Protect yourself by wearing long sleeves, pants, a wide-brimmed hat, and sunglasses year round, whenever you are outdoors.  Notify your caregiver of new moles or changes in moles, especially if there is a change in shape or color. Also notify your caregiver if a mole is larger than the size of a pencil eraser.  Stay current with your immunizations. Document Released: 03/21/2011 Document Revised: 12/31/2012 Document Reviewed: 03/21/2011 [facility] Patient Information 2014 [place].

## 2014-06-13 NOTE — Progress Notes (Signed)
Elaine Gray [birth year] [id number]        58 y.o.  G2P0020 for annual exam.  Several issues noted below.  Past medical history,surgical history, problem list, medications, allergies, family history and social history were all reviewed and documented as reviewed in the EPIC chart.  ROS:  12 system ROS performed with pertinent positives and negatives included in the history, assessment and plan.   Additional significant findings :  none   Exam: [name] Vitals:   06/13/14 0847  BP: 120/74  Height: 5\' 6"  (1.676 m)  Weight: 167 lb (75.751 kg)   General appearance:  Normal affect, orientation and appearance. Skin: Grossly normal HEENT: Without gross lesions.  No cervical or supraclavicular adenopathy. Thyroid normal.  Lungs:  Clear without wheezing, rales or rhonchi Cardiac: RR, without RMG Abdominal:  Soft, nontender, without masses, guarding, rebound, organomegaly or hernia Breasts:  Examined lying and sitting without masses, retractions, discharge or axillary adenopathy. Pelvic:  Ext/BUS/vagina with mild atrophic changes. Pap done. Wet prep done  Adnexa  Without masses or tenderness    Anus and perineum  Normal   Rectovaginal  Normal sphincter tone without palpated masses or tenderness.    Assessment/Plan:  58 y.o. G39P0020 female for annual exam .   1. Postmenopausal/HRT.  Status post LAVH BSO for endometriosis.  Patient continues on Climara 0.05 mg patches. Had ran out and had unacceptable fatigue and hot flashes.  I again reviewed the whole issue of HRT with her to include the WHI study with increased risk of stroke, heart attack, DVT and breast cancer. The ACOG and NAMS statements for lowest dose for the shortest period of time reviewed. Transdermal versus oral first-pass effect benefit discussed.  Patient understands and wants to continue and I refilled her x1 year. 2. Vaginal odor.  Patient wonders whether it's not more GI related. No discharge itching or  irritation. Exam is normal. Wet prep is negative. Will monitor at present. 3. Hypothyroid.  Patient gets her prescription from our office per Dr. Cherylann Banas. Will check TSH today and refill her Armour Thyroid accordingly. Also going to refer to endocrinology at her request because she is wondering whether she does not have some component of metabolic syndrome making weight loss difficult which apparently her 2 sisters have. We'll also have them look at her thyroid replacement. 4. Mammography 04/2013.  Schedule mammogram now. SBE monthly reviewed. 5. DEXA 2012 normal.  Plan repeat DEXA in several years. Check vitamin D level today. Increase calcium vitamin D reviewed. 6. Pap smear 2012.  Pap smear of vaginal cuff today. No history of significant abnormal Pap smears. Reviewed current screening guidelines in options to stop screening altogether she is status post hysterectomy for benign indications reviewed. Will readdress on an annual basis. 7. Colonoscopy due now.  Patient is going to schedule with Dr. Olevia Perches. 8. Health maintenance.  Baseline CBC comp has a metabolic panel lipid profile urinalysis vitamin D TSH ordered. Follow up in one year, sooner as needed.     Anastasio Auerbach MD, 9:08 AM 06/13/2014

## 2014-06-13 NOTE — Telephone Encounter (Signed)
Message copied by Thamas Jaegers on Fri Jun 13, 2014  9:42 AM ------      Message from: Elaine Gray      Created: Fri Jun 13, 2014  9:33 AM       Schedule an appointment with [facility] endocrinology reference patient's questions about metabolic syndrome, difficulty losing weight, hypothyroid ------

## 2014-06-13 NOTE — Telephone Encounter (Signed)
Referral placed for endocrinology they will contact to schedule.

## 2014-06-14 LAB — VITAMIN D 25 HYDROXY (VIT D DEFICIENCY, FRACTURES): Vit D, 25-Hydroxy: 43 ng/mL (ref 30–89)

## 2014-06-16 LAB — CYTOLOGY - PAP

## 2014-06-23 NOTE — Telephone Encounter (Signed)
Appointment 06/26/14 @ 8:15 am

## 2014-06-26 ENCOUNTER — Ambulatory Visit (INDEPENDENT_AMBULATORY_CARE_PROVIDER_SITE_OTHER): Payer: BC Managed Care – PPO | Admitting: Internal Medicine

## 2014-06-26 ENCOUNTER — Encounter: Payer: Self-pay | Admitting: Internal Medicine

## 2014-06-26 VITALS — BP 114/64 | HR 83 | Temp 97.9°F | Resp 12 | Ht 66.0 in | Wt 168.6 lb

## 2014-06-26 DIAGNOSIS — R5383 Other fatigue: Secondary | ICD-10-CM

## 2014-06-26 LAB — VITAMIN B12: VITAMIN B 12: 1176 pg/mL — AB (ref 211–911)

## 2014-06-26 NOTE — Progress Notes (Signed)
Patient ID: Elaine Gray, female   DOB: 21-Jun-1956, 58 y.o.   MRN: [id number]   HPI  Elaine Gray is a 58 y.o.-year-old female, referred by Dr Phineas Real, in consultation for possible metabolic sd. and hypothyroidism. PCP: Dr Dellie Catholic.   Hypothyroidism: Pt. has been dx with hypothyroidism in 1993; is on Armour Thyroid 90 mg, taken: - fasting - with water - separated by >30 min from b'fast  - takes the DHEA at the same time - no calcium, iron, PPIs, multivitamins   I reviewed pt's thyroid tests: Lab Results  Component Value Date   TSH 1.677 06/13/2014   TSH 1.837 05/22/2013   TSH 1.066 05/16/2012    Pt describes: - + fatigue - + morning brain fog - always, in am - + problems focusing - started a new job recently - tried Adderall - helped  - + weight gain (7 lbs in 1 year; 12 lbs since quitting smoking 04/22/2009) - + cold intolerance - even before - + severe constipation; + bloating - + dry skin - + hair falling - + thinning eye brows - + anxiety/ + depression  She is a pescaterian for 41 years. She exercises every day   Pt denies feeling nodules in neck, + hoarseness (late afternoon) - also post nasaldrip; happened in the last 2 years, no dysphagia/odynophagia, SOB with lying down.  08/08/2005 Thyroid U/S: Clinical Data: Pressure in throat.  ULTRASOUND OF THE THYROID:  Comparison: None.  Technique: Ultrasound examination of the thyroid gland and adjacent soft tissue structures was performed.  Findings: Overall size and contour of the thyroid gland is normal. The right lobe is 5.1 x 1.6 x 1.1 cm. The left is 4.1 x 1.1 x 1.1 cm. The isthmus is 3 mm in thickness. Echotexture is homogeneous. There are no focal lesions, cystic or solid.  IMPRESSION:  Normal exam.  She has + FH of thyroid disorders in: mother - hypothyroidism. No FH of thyroid cancer.  No h/o radiation tx to head or neck. No recent use of iodine supplements.  Weight gain: - no HL: Lab Results   Component Value Date   CHOL 179 06/13/2014   HDL 72 06/13/2014   LDLCALC 92 06/13/2014   TRIG 73 06/13/2014   CHOLHDL 2.5 06/13/2014  She is not on cholesterol meds. - no HTN BP today 114/64. Not on BP meds - no fasting hyperglycemia: Component     Latest Ref Rng 06/13/2014  Sodium     135 - 145 mEq/L 140  Potassium     3.5 - 5.3 mEq/L 4.4  Chloride     96 - 112 mEq/L 106  CO2     19 - 32 mEq/L 19  Glucose     70 - 99 mg/dL 92  BUN     6 - 23 mg/dL 17  Creatinine     0.50 - 1.10 mg/dL 0.82  Total Bilirubin     0.2 - 1.2 mg/dL 0.5  Alkaline Phosphatase     39 - 117 U/L 58  AST     0 - 37 U/L 16  ALT     0 - 35 U/L 20  Total Protein     6.0 - 8.3 g/dL 6.7  Albumin     3.5 - 5.2 g/dL 4.4  Calcium     8.4 - 10.5 mg/dL 9.3    ROS: Constitutional: + see HPI Eyes: no blurry vision, no xerophthalmia ENT: no sore throat, no nodules palpated  in throat, no dysphagia/odynophagia, no hoarseness, + tinnitus Cardiovascular: no CP/SOB/palpitations/leg swelling Respiratory: no cough/SOB Gastrointestinal: no N/V/D/+ C Musculoskeletal:+ all: muscle/joint aches Skin: no rashes Neurological: no tremors/numbness/tingling/dizziness Psychiatric: no depression/anxiety + low libido  Past Medical History  Diagnosis Date  . Thyroid disease     hypothyroid  . Endometriosis   . Basal cell carcinoma 2005,2006    Chest-Leg  . Seasonal allergies   . Glaucoma   . Allergy     mild respiratory allergies  . Arthritis     in neck, thumbs, knee and eyes    Past Surgical History  Procedure Laterality Date  . Vaginal hysterectomy  1996    [facility] BSO  . Pelvic laparoscopy      Endometriosis  . Basal cell carcinoma excision  2005,2006    Chest-Leg  . Plantarfaciotomy  2006  . Oophorectomy    . Knee surgery      Arthroscopic   History   Social History   Social History Main Topics  . Smoking status: Former Research scientist (life sciences)  . Smokeless tobacco: Never Used  . Alcohol Use: Yes     Comment:  social-Rare  . Drug Use: No  . Sexual Activity: Yes    Birth Control/ Protection: Surgical   Social History Narrative   Married   Research scientist (life sciences)   First menstrual cycle: 11 yrs   Pescatarian diet: 41 yrs    Moderate exercise: walking, yoga, light weights, biking, yard work    Current Outpatient Prescriptions on File Prior to Visit  Medication Sig Dispense Refill  . ARMOUR THYROID 90 MG tablet take 1 tablet by mouth daily  30 tablet  0  . B Complex Vitamins (VITAMIN B COMPLEX PO) Take by mouth.      . Calcium Carbonate-Vitamin D (CALCIUM + D PO) Take by mouth daily.        Marland Kitchen CLIMARA 0.05 MG/24HR patch APPLY 1 PATCH ONCE A WEEK AS DIRECTED  4 patch  11  . Cyanocobalamin (VITAMIN B-12 CR PO) Take by mouth.      . Fexofenadine HCl (ALLEGRA PO) Take by mouth as needed.        . latanoprost (XALATAN) 0.005 % ophthalmic solution 1 drop at bedtime.      . Naproxen Sodium (ALEVE PO) Take by mouth as needed.         No current facility-administered medications on file prior to visit.   Allergies  Allergen Reactions  . Codeine Swelling  . Amoxicillin-Pot Clavulanate Diarrhea    Augmentin   . Sulfonamide Derivatives    Family History  Problem Relation Age of Onset  . Diabetes Mother   . Hypertension Mother   . Heart disease Mother   . Uterine cancer Mother   . Dementia Mother   . Thyroid disease Mother   . Hypertension Father   . Heart disease Father   . Diabetes Maternal Grandfather   . Hypertension Maternal Grandfather   . Heart disease Paternal Grandfather   . Hypertension Paternal Grandfather   . Glaucoma Maternal Grandmother   . Hypertension Maternal Grandmother   . Hypertension Sister   . Thyroid disease Sister   . Cancer Maternal Uncle   . Hypertension Paternal Grandmother   . Heart disease Paternal Grandmother    PE: BP 114/64  Pulse 83  Temp(Src) 97.9 F (36.6 C) (Oral)  Resp 12  Ht 5' 6" (1.676 m)  Wt 168 lb 9.6 oz (76.476 kg)  BMI 27.23 kg/m2  SpO2  97%  Wt Readings from Last 3 Encounters:  06/26/14 168 lb 9.6 oz (76.476 kg)  06/13/14 167 lb (75.751 kg)  05/22/13 160 lb (72.576 kg)   Constitutional: overweight, in NAD Eyes: PERRLA, EOMI, no exophthalmos ENT: moist mucous membranes, no thyromegaly, no cervical lymphadenopathy Cardiovascular: RRR, No MRG Respiratory: CTA B Gastrointestinal: abdomen soft, NT, ND, BS+ Musculoskeletal: no deformities, strength intact in all 4 Skin: moist, warm, no rashes Neurological: no tremor with outstretched hands, DTR normal in all 4  ASSESSMENT: 1. Hypothyroidism - well replaced on Armour thyroid  2. Fatigue  3. Weigh gain  PLAN:  1. Patient with long-standing hypothyroidism, on Armour therapy. She has multiple complaints (see HPI), but these do not appear to be 2/2 her hypothyroidism. She does not appear to have a goiter, thyroid nodules, or neck compression symptoms - We discussed about correct intake of Armour, fasting, with water, separated by at least 30 minutes from breakfast, and separated by more than 4 hours from calcium, iron, multivitamins, acid reflux medications (PPIs). - reviewed recent TSH level - explained this is great; no need to repeat today; continue same dose of Armour 90 mg daily - pt will continue to f/u with PCP  2. Fatigue - mostly in am, but this has been for all her life, as she is not a morning person - vit D normal - no anemia - hypothyroidism - well controlled - will check a B12 vitamin; she is on supplementation - she would like to start back on Adderall and I think this would help  3. Weight gain - not related to hypothyroidism, as this is well controlled - suggested to try a vegan diet at least until she loses the weight - Adderall will help with this, also  We discussed about the criteria for metabolic sd. and she is not meeting them: NCEP ATP III definition: metabolic syndrome is present if 3 or more of the following 5 criteria are met:   waist  circumference over 40 inches (men) or 35 inches (women)  blood pressure over 130/85 mmHg  fasting triglyceride (TG) level over 150 mg/dl  fasting high-density lipoprotein (HDL) cholesterol level less than 40 mg/dl (men) or 50 mg/dl (women)   fasting blood sugar over 100 mg/dl.  - time spent with the patient: 1 hour, of which >50% was spent in obtaining information about her symptoms, reviewing her previous labs, evaluations, and treatments, counseling her about her conditions (please see the discussed topics above), and developing a plan to further investigate them;  she had a number of questions which I addressed.  Office Visit on 06/26/2014  Component Date Value Ref Range Status  . Vitamin B-12 06/26/2014 1176* 211 - 911 pg/mL Final   No vit B12 def. (even a little high, pt taking B complex supplement).

## 2014-06-26 NOTE — Patient Instructions (Signed)
Please stop at the lab.  Please return to see me as needed. 

## 2014-07-02 ENCOUNTER — Other Ambulatory Visit: Payer: Self-pay | Admitting: Gynecology

## 2014-07-21 ENCOUNTER — Encounter: Payer: Self-pay | Admitting: Internal Medicine

## 2014-07-31 ENCOUNTER — Other Ambulatory Visit: Payer: Self-pay | Admitting: Gynecology

## 2014-10-31 ENCOUNTER — Other Ambulatory Visit: Payer: Self-pay | Admitting: Neurosurgery

## 2014-10-31 DIAGNOSIS — M5023 Other cervical disc displacement, cervicothoracic region: Secondary | ICD-10-CM

## 2015-01-12 ENCOUNTER — Other Ambulatory Visit: Payer: Self-pay | Admitting: Gynecology

## 2015-02-18 ENCOUNTER — Encounter: Payer: Self-pay | Admitting: Internal Medicine

## 2015-04-10 ENCOUNTER — Encounter: Payer: Self-pay | Admitting: Internal Medicine

## 2015-04-15 ENCOUNTER — Encounter: Payer: Self-pay | Admitting: Gastroenterology

## 2015-04-24 ENCOUNTER — Telehealth: Payer: Self-pay | Admitting: *Deleted

## 2015-04-24 DIAGNOSIS — Z01419 Encounter for gynecological examination (general) (routine) without abnormal findings: Secondary | ICD-10-CM

## 2015-04-24 NOTE — Telephone Encounter (Signed)
Left message on pt voicemail to schedule lab appointment.

## 2015-04-24 NOTE — Telephone Encounter (Signed)
CBC, comprehensive metabolic panel, lipid profile, urinalysis, vitamin D, TSH

## 2015-04-24 NOTE — Telephone Encounter (Signed)
Pt has annual schedule on 06/15/15 wants labs done prior to annual. Please advise

## 2015-05-08 ENCOUNTER — Encounter: Payer: Self-pay | Admitting: Internal Medicine

## 2015-05-29 ENCOUNTER — Ambulatory Visit (AMBULATORY_SURGERY_CENTER): Payer: Self-pay

## 2015-05-29 VITALS — Ht 65.5 in | Wt 167.0 lb

## 2015-05-29 DIAGNOSIS — Z860100 Personal history of colon polyps, unspecified: Secondary | ICD-10-CM

## 2015-05-29 MED ORDER — MOVIPREP 100 G PO SOLR: 1.0000 | Freq: Once | ORAL | Status: DC

## 2015-05-29 NOTE — Progress Notes (Signed)
No allergies to eggs or soy No diet/weight loss meds No home oxygen No past problems with anesthesia  Refused emmi 

## 2015-06-05 ENCOUNTER — Telehealth: Payer: Self-pay | Admitting: *Deleted

## 2015-06-05 NOTE — Telephone Encounter (Signed)
-----   Message from Sinclair Grooms sent at 06/05/2015  2:27 PM EDT ----- Regarding: labs Patient is coming in on Sep 29 for phy with TF. She wants to do lab work on Sep 26 can you please ask TF what labs he wants and place order. Thx

## 2015-06-05 NOTE — Telephone Encounter (Signed)
Dr.Fontiane please see below.

## 2015-06-05 NOTE — Telephone Encounter (Signed)
Dr.Fontaine never mind this has been done already. Please disregard.

## 2015-06-11 ENCOUNTER — Encounter: Payer: Self-pay | Admitting: Gastroenterology

## 2015-06-11 ENCOUNTER — Ambulatory Visit (AMBULATORY_SURGERY_CENTER): Payer: BLUE CROSS/BLUE SHIELD | Admitting: Gastroenterology

## 2015-06-11 VITALS — BP 123/56 | HR 65 | Temp 97.9°F | Resp 31 | Ht 65.0 in | Wt 167.0 lb

## 2015-06-11 DIAGNOSIS — D125 Benign neoplasm of sigmoid colon: Secondary | ICD-10-CM | POA: Diagnosis not present

## 2015-06-11 DIAGNOSIS — D124 Benign neoplasm of descending colon: Secondary | ICD-10-CM

## 2015-06-11 DIAGNOSIS — D120 Benign neoplasm of cecum: Secondary | ICD-10-CM

## 2015-06-11 DIAGNOSIS — D128 Benign neoplasm of rectum: Secondary | ICD-10-CM | POA: Diagnosis not present

## 2015-06-11 DIAGNOSIS — Z86010 Personal history of colonic polyps: Secondary | ICD-10-CM | POA: Diagnosis not present

## 2015-06-11 DIAGNOSIS — D126 Benign neoplasm of colon, unspecified: Secondary | ICD-10-CM

## 2015-06-11 DIAGNOSIS — D123 Benign neoplasm of transverse colon: Secondary | ICD-10-CM

## 2015-06-11 MED ORDER — SODIUM CHLORIDE 0.9 % IV SOLN: 500.0000 mL | INTRAVENOUS | Status: DC

## 2015-06-11 NOTE — Patient Instructions (Signed)
Discharge instructions given. Handouts on polyps and diverticulosis. Hold aspirin and all aspirin base procducts  For 2 weeks. Resume previous medications. YOU HAD AN ENDOSCOPIC PROCEDURE TODAY AT [facility]:   Refer to the procedure report that was given to you for any specific questions about what was found during the examination.  If the procedure report does not answer your questions, please call your gastroenterologist to clarify.  If you requested that your care partner not be given the details of your procedure findings, then the procedure report has been included in a sealed envelope for you to review at your convenience later.  YOU SHOULD EXPECT: Some feelings of bloating in the abdomen. Passage of more gas than usual.  Walking can help get rid of the air that was put into your GI tract during the procedure and reduce the bloating. If you had a lower endoscopy (such as a colonoscopy or flexible sigmoidoscopy) you may notice spotting of blood in your stool or on the toilet paper. If you underwent a bowel prep for your procedure, you may not have a normal bowel movement for a few days.  Please Note:  You might notice some irritation and congestion in your nose or some drainage.  This is from the oxygen used during your procedure.  There is no need for concern and it should clear up in a day or so.  SYMPTOMS TO REPORT IMMEDIATELY:   Following lower endoscopy (colonoscopy or flexible sigmoidoscopy):  Excessive amounts of blood in the stool  Significant tenderness or worsening of abdominal pains  Swelling of the abdomen that is new, acute  Fever of 100F or higher   For urgent or emergent issues, a gastroenterologist can be reached at any hour by calling [phone].   DIET: Your first meal following the procedure should be a small meal and then it is ok to progress to your normal diet. Heavy or fried foods are harder to digest and may make you feel nauseous or  bloated.  Likewise, meals heavy in dairy and vegetables can increase bloating.  Drink plenty of fluids but you should avoid alcoholic beverages for 24 hours.  ACTIVITY:  You should plan to take it easy for the rest of today and you should NOT DRIVE or use heavy machinery until tomorrow (because of the sedation medicines used during the test).    FOLLOW UP: Our staff will call the number listed on your records the next business day following your procedure to check on you and address any questions or concerns that you may have regarding the information given to you following your procedure. If we do not reach you, we will leave a message.  However, if you are feeling well and you are not experiencing any problems, there is no need to return our call.  We will assume that you have returned to your regular daily activities without incident.  If any biopsies were taken you will be contacted by phone or by letter within the next 1-3 weeks.  Please call us at [phone] if you have not heard about the biopsies in 3 weeks.    SIGNATURES/CONFIDENTIALITY: You and/or your care partner have signed paperwork which will be entered into your electronic medical record.  These signatures attest to the fact that that the information above on your After Visit Summary has been reviewed and is understood.  Full responsibility of the confidentiality of this discharge information lies with you and/or your care-partner.

## 2015-06-11 NOTE — Progress Notes (Signed)
Report to PACU, RN, vss, BBS= Clear.  

## 2015-06-11 NOTE — Progress Notes (Signed)
Called to room to assist during endoscopic procedure.  Patient ID and intended procedure confirmed with present staff. Received instructions for my participation in the procedure from the performing physician.  

## 2015-06-11 NOTE — Progress Notes (Signed)
Patient requests to speak with MD regarding chronic constipation. Also wants to speak with him regarding 4 surgeries for endometriosis and probable scar tissue.

## 2015-06-11 NOTE — Op Note (Signed)
[facility]  [address] [place], [id number]   COLONOSCOPY PROCEDURE REPORT  PATIENT: Elaine Gray  MR#: [id number] BIRTHDATE: [birth year] , 5  yrs. old GENDER: female ENDOSCOPIST: [place] Cellar, MD REFERRED BY: PROCEDURE DATE:  06/11/2015 PROCEDURE:   Colonoscopy with snare polypectomy First Screening Colonoscopy - Avg.  risk and is 50 yrs.  old or older - No.  Prior Negative Screening - Now for repeat screening. N/A  History of Adenoma - Now for follow-up colonoscopy & has been > or = to 3 yrs.  Yes hx of adenoma.  Has been 3 or more years since last colonoscopy.  Polyps removed today? Yes ASA CLASS:   Class II INDICATIONS:Surveillance due to prior colonic neoplasia and Colorectal Neoplasm Risk Assessment for this procedure is average risk. MEDICATIONS: Propofol 350 mg IV  DESCRIPTION OF PROCEDURE:   After the risks benefits and alternatives of the procedure were thoroughly explained, informed consent was obtained.  The digital rectal exam revealed no abnormalities of the rectum.   The LB [id number]  endoscope was introduced through the anus and advanced to the cecum, which was identified by both the appendix and ileocecal valve. No adverse events experienced.   The quality of the prep was adequate  The instrument was then slowly withdrawn as the colon was fully examined. Estimated blood loss is zero unless otherwise noted in this procedure report.      COLON FINDINGS: Two sessile polyps measuring 4 mm in size were found at the cecum.  Polypectomies were performed with a cold snare.  The resection was complete, the polyp tissue was completely retrieved and sent to histology.   A sessile polyp measuring 4 mm in size was found in the transverse colon.  A polypectomy was performed with a cold snare.  The resection was complete, the polyp tissue was completely retrieved and sent to histology.   A sessile polyp measuring 8 mm in size was  found in the transverse colon.  A polypectomy was performed using snare cautery.  The resection was complete, the polyp tissue was completely retrieved and sent to histology.   Two sessile polyps measuring 4 mm in size were found in the descending colon.  Polypectomies were performed with a cold snare.  The resection was complete, the polyp tissue was completely retrieved and sent to histology.   A sessile polyp measuring 4 mm in size was found in the sigmoid colon.  A polypectomy was performed with a cold snare.  The resection was complete, the polyp tissue was completely retrieved and sent to histology.   A sessile polyp measuring 6 mm in size was found in the sigmoid colon.  A polypectomy was performed using snare cautery.  The resection was complete, the polyp tissue was completely retrieved and sent to histology.   A sessile polyp measuring 5 mm in size was found in the rectum.  A polypectomy was performed using snare cautery.  The resection was complete, the polyp tissue was completely retrieved and sent to histology.   Melanosis coli was found throughout the entire examined colon.   There was mild diverticulosis noted in the left colon, descending colon, and transverse colon.   The examination was otherwise normal.  Retroflexed views revealed no abnormalities. The time to cecum = 7.1 (removed polyps during intubation) Withdrawal time = 28.3   The scope was withdrawn and the procedure completed. COMPLICATIONS: There were no immediate complications.  ENDOSCOPIC IMPRESSION: 1.   Two sessile polyps  were found at the cecum; polypectomies were performed with a cold snare 2.   Sessile polyp was found in the transverse colon; polypectomy was performed with a cold snare 3.   Sessile polyp was found in the transverse colon; polypectomy was performed using snare cautery 4.   Two sessile polyps were found in the descending colon; polypectomies were performed with a cold snare 5.    Sessile polyp was found in the sigmoid colon; polypectomy was performed with a cold snare 6.   Sessile polyp was found in the sigmoid colon; polypectomy was performed using snare cautery 7.   Sessile polyp was found in the rectum; polypectomy was performed using snare cautery 8.   Melanosis coli was found throughout the entire examined colon 9.   Mild diverticulosis was noted in the left colon, descending colon, and transverse colon 10.   The examination was otherwise normal  RECOMMENDATIONS: 1.  Hold Aspirin and all other NSAIDS for 2 weeks to minimize the risk of bleeding at polypectomy site. 2.  Resume diet 3.  Resume medications 4.  Await pathology results.  Further recommendations will be made pending this result.  eSigned:  [place] Cellar, MD 06/11/2015 11:50 AM   cc: the patient   PATIENT NAME:  Elaine Gray, Elaine Gray MR#: [id number]

## 2015-06-12 ENCOUNTER — Encounter (HOSPITAL_COMMUNITY): Payer: Self-pay | Admitting: Emergency Medicine

## 2015-06-12 ENCOUNTER — Emergency Department (HOSPITAL_COMMUNITY)
Admission: EM | Admit: 2015-06-12 | Discharge: 2015-06-12 | Disposition: A | Discharge status: Home / Self Care | Payer: BLUE CROSS/BLUE SHIELD | Attending: Emergency Medicine | Admitting: Emergency Medicine

## 2015-06-12 ENCOUNTER — Telehealth: Payer: Self-pay | Admitting: *Deleted

## 2015-06-12 ENCOUNTER — Telehealth: Payer: Self-pay | Admitting: Gastroenterology

## 2015-06-12 ENCOUNTER — Emergency Department (HOSPITAL_COMMUNITY): Payer: BLUE CROSS/BLUE SHIELD

## 2015-06-12 DIAGNOSIS — Z79899 Other long term (current) drug therapy: Secondary | ICD-10-CM | POA: Diagnosis not present

## 2015-06-12 DIAGNOSIS — Z7952 Long term (current) use of systemic steroids: Secondary | ICD-10-CM | POA: Insufficient documentation

## 2015-06-12 DIAGNOSIS — H409 Unspecified glaucoma: Secondary | ICD-10-CM | POA: Insufficient documentation

## 2015-06-12 DIAGNOSIS — Z880 Allergy status to penicillin: Secondary | ICD-10-CM | POA: Insufficient documentation

## 2015-06-12 DIAGNOSIS — Z85828 Personal history of other malignant neoplasm of skin: Secondary | ICD-10-CM | POA: Diagnosis not present

## 2015-06-12 DIAGNOSIS — R079 Chest pain, unspecified: Secondary | ICD-10-CM

## 2015-06-12 DIAGNOSIS — Z87891 Personal history of nicotine dependence: Secondary | ICD-10-CM | POA: Insufficient documentation

## 2015-06-12 DIAGNOSIS — E039 Hypothyroidism, unspecified: Secondary | ICD-10-CM | POA: Diagnosis not present

## 2015-06-12 DIAGNOSIS — R911 Solitary pulmonary nodule: Secondary | ICD-10-CM | POA: Diagnosis not present

## 2015-06-12 DIAGNOSIS — Z8742 Personal history of other diseases of the female genital tract: Secondary | ICD-10-CM | POA: Insufficient documentation

## 2015-06-12 DIAGNOSIS — R1013 Epigastric pain: Secondary | ICD-10-CM | POA: Insufficient documentation

## 2015-06-12 DIAGNOSIS — Z8739 Personal history of other diseases of the musculoskeletal system and connective tissue: Secondary | ICD-10-CM | POA: Diagnosis not present

## 2015-06-12 LAB — I-STAT TROPONIN, ED
TROPONIN I, POC: 0.08 ng/mL (ref 0.00–0.08)
Troponin i, poc: 0 ng/mL (ref 0.00–0.08)

## 2015-06-12 LAB — BASIC METABOLIC PANEL WITH GFR
Anion gap: 8 (ref 5–15)
BUN: 11 mg/dL (ref 6–20)
CO2: 23 mmol/L (ref 22–32)
Calcium: 9 mg/dL (ref 8.9–10.3)
Chloride: 107 mmol/L (ref 101–111)
Creatinine, Ser: 0.74 mg/dL (ref 0.44–1.00)
GFR calc Af Amer: >60 mL/min
GFR calc non Af Amer: >60 mL/min
Glucose, Bld: 100 mg/dL — ABNORMAL HIGH (ref 65–99)
Potassium: 3.8 mmol/L (ref 3.5–5.1)
Sodium: 138 mmol/L (ref 135–145)

## 2015-06-12 LAB — HEPATIC FUNCTION PANEL
ALT: 30 U/L (ref 14–54)
AST: 23 U/L (ref 15–41)
Albumin: 4.2 g/dL (ref 3.5–5.0)
Alkaline Phosphatase: 66 U/L (ref 38–126)
Bilirubin, Direct: <0.1 mg/dL — ABNORMAL LOW (ref 0.1–0.5)
Total Bilirubin: 0.6 mg/dL (ref 0.3–1.2)
Total Protein: 7.1 g/dL (ref 6.5–8.1)

## 2015-06-12 LAB — CBC
HCT: 42.2 % (ref 36.0–46.0)
Hemoglobin: 14.5 g/dL (ref 12.0–15.0)
MCH: 32.1 pg (ref 26.0–34.0)
MCHC: 34.4 g/dL (ref 30.0–36.0)
MCV: 93.4 fL (ref 78.0–100.0)
Platelets: 272 K/uL (ref 150–400)
RBC: 4.52 MIL/uL (ref 3.87–5.11)
RDW: 12.9 % (ref 11.5–15.5)
WBC: 7.3 K/uL (ref 4.0–10.5)

## 2015-06-12 LAB — LIPASE, BLOOD: Lipase: 28 U/L (ref 22–51)

## 2015-06-12 LAB — D-DIMER, QUANTITATIVE: D-Dimer, Quant: 0.75 ug{FEU}/mL — ABNORMAL HIGH (ref 0.00–0.48)

## 2015-06-12 MED ORDER — GI COCKTAIL ~~LOC~~
30.0000 mL | Freq: Once | ORAL | Status: AC
  Administered 2015-06-12: 30 mL via ORAL
  Filled 2015-06-12: qty 30

## 2015-06-12 MED ORDER — SODIUM CHLORIDE 0.9 % IV BOLUS (SEPSIS)
1000.0000 mL | Freq: Once | INTRAVENOUS | Status: AC
  Administered 2015-06-12: 1000 mL via INTRAVENOUS

## 2015-06-12 MED ORDER — FENTANYL CITRATE (PF) 100 MCG/2ML IJ SOLN
100.0000 ug | Freq: Once | INTRAMUSCULAR | Status: AC
  Administered 2015-06-12: 100 ug via INTRAVENOUS
  Filled 2015-06-12: qty 2

## 2015-06-12 MED ORDER — MORPHINE SULFATE (PF) 4 MG/ML IV SOLN
4.0000 mg | Freq: Once | INTRAVENOUS | Status: AC
  Administered 2015-06-12: 4 mg via INTRAVENOUS
  Filled 2015-06-12: qty 1

## 2015-06-12 MED ORDER — IBUPROFEN 600 MG PO TABS: 600.0000 mg | ORAL_TABLET | Freq: Three times a day (TID) | ORAL | Status: DC | PRN

## 2015-06-12 MED ORDER — IOHEXOL 350 MG/ML SOLN
100.0000 mL | Freq: Once | INTRAVENOUS | Status: AC | PRN
  Administered 2015-06-12: 100 mL via INTRAVENOUS

## 2015-06-12 MED ORDER — FENTANYL CITRATE (PF) 100 MCG/2ML IJ SOLN
50.0000 ug | Freq: Once | INTRAMUSCULAR | Status: AC
  Administered 2015-06-12: 50 ug via INTRAVENOUS
  Filled 2015-06-12: qty 2

## 2015-06-12 MED ORDER — OMEPRAZOLE 20 MG PO CPDR: 20.0000 mg | DELAYED_RELEASE_CAPSULE | Freq: Every day | ORAL | Status: DC

## 2015-06-12 NOTE — Telephone Encounter (Signed)
Had a colonoscopy yesterday. She is having chest pain. Hurting right above where heart is. She states she had this all night. She did take a Tylenol and slept. Hurts worse when flat.Denies nausea, arm pain or jaw pain. Patient instructed to go to ED. She states she wants me to ask a doctor too. Per Dr. Deatra Ina, go to ED.

## 2015-06-12 NOTE — Telephone Encounter (Signed)
Unable to reach patient at either number. Left a message to call me ASAP.

## 2015-06-12 NOTE — Telephone Encounter (Signed)
Pt has gone to ED; called as Elaine Gray

## 2015-06-12 NOTE — Telephone Encounter (Signed)
  Follow up Call-  Call back number 06/11/2015  Post procedure Call Back phone  # [phone]  Permission to leave phone message Yes     Patient questions:  Do you have a fever, pain , or abdominal swelling? No. Pain Score  0 *  Have you tolerated food without any problems? Yes.    Have you been able to return to your normal activities? Yes.    Do you have any questions about your discharge instructions: Diet   No. Medications  No. Follow up visit  No.  Do you have questions or concerns about your Care? No.  Actions: * If pain score is 4 or above: No action needed, pain <4.

## 2015-06-12 NOTE — ED Notes (Signed)
Pt continues to state pain is not relieved.  Notified MD.  New orders given.  No SOB noted.  Vitals stable

## 2015-06-12 NOTE — ED Provider Notes (Signed)
5:29 PM EKG 2 is negative.  Troponin x 2 negative. Suspect more GERD. Dc home with pcp follow up.  Pain is been present TODAY.  CT scan of her chest is negative for acute pulmonary embolism.  She does have a prior history of tobacco abuse.  Multiple pulmonary nodules were noted.  Patient is aware of these pulmonary nodules after our discussion and understands importance of repeat CT scan of her chest in 3-6 months given her high-risk status for bronchogenic carcinoma.  She understands to follow-up with her primary care physician.  She also asked for a copy of her CT scan as her brother is a Stage manager and she would like for him to look at the CT scan.   EKG Interpretation #2  Date/Time:  Friday June 12 2015 16:44:06 EDT Ventricular Rate:  77 PR Interval:  169 QRS Duration: 66 QT Interval:  413 QTC Calculation: 467 R Axis:   69 Text Interpretation:  Sinus rhythm Probable left atrial enlargement Baseline wander in lead(s) V3 No significant change was found Confirmed by CAMPOS  MD, KEVIN ([id number]) on 06/12/2015 5:29:01 PM      Ct Angio Chest Pe W/cm &/or Wo Cm  06/12/2015   CLINICAL DATA:  Chest pain and shortness of breath today. Status post colonoscopy 06/11/2015.  EXAM: CT ANGIOGRAPHY CHEST WITH CONTRAST  TECHNIQUE: Multidetector CT imaging of the chest was performed using the standard protocol during bolus administration of intravenous contrast. Multiplanar CT image reconstructions and MIPs were obtained to evaluate the vascular anatomy.  CONTRAST:  100 ml OMNIPAQUE IOHEXOL 350 MG/ML SOLN  COMPARISON:  Plain film of the chest earlier today.  FINDINGS: No pulmonary embolus is identified. There is no axillary, hilar or mediastinal lymphadenopathy. Heart size is normal. No pleural or pericardial effusion.  There is some emphysematous change in the lung apices. Pulmonary nodules are identified including the following:  0.5 cm subpleural nodule in the right upper lobe on image 27.  0.6 cm subpleural  nodule along the undersurface of the minor fissure on image 60.  0.7 cm left upper lobe nodule on image 45.  0.3 cm subpleural nodule in the left lower lobe on image 35. The lungs otherwise unremarkable.  Incidentally imaged upper abdomen demonstrates no focal abnormality. No lytic or sclerotic bony lesion is seen. There is severe thoracic spondylosis.  Review of the MIP images confirms the above findings.  IMPRESSION: Negative for pulmonary embolus or acute abnormality.  Pulmonary nodules as described above. The largest is in the left upper lobe measuring 0.7 cm. If the patient is at high risk for bronchogenic carcinoma, follow-up chest CT at 3-6 months is recommended. If the patient is at low risk for bronchogenic carcinoma, follow-up chest CT at 6-12 months is recommended. This recommendation follows the consensus statement: Guidelines for Management of Small Pulmonary Nodules Detected on CT Scans: A Statement from the [employer] as published in Radiology 2005; 237:395-400.   Electronically Signed   By: Inge Rise M.D.   On: 06/12/2015 16:37   Dg Abd Acute W/chest  06/12/2015   CLINICAL DATA:  Left side cp since colonoscopy yesterday. Hx of basal cell carcinoma  EXAM: DG ABDOMEN ACUTE W/ 1V CHEST  COMPARISON:  None.  FINDINGS: Cardiomediastinal silhouette is within normal limits. The lungs are free of focal consolidations and pleural effusions.  Bowel gas pattern is nonobstructive. No free intraperitoneal air. Mild scoliosis convex left.  IMPRESSION: 1.  No evidence for acute cardiopulmonary abnormality. 2. Nonobstructive bowel gas  pattern.   Electronically Signed   By: Nolon Nations M.D.   On: 06/12/2015 13:27   I personally reviewed the imaging tests through PACS system I reviewed available ER/hospitalization records through the [place], MD 06/12/15 1731

## 2015-06-12 NOTE — ED Provider Notes (Signed)
CSN: [id number]     Arrival date & time 06/12/15  1127 History   First MD Initiated Contact with Patient 06/12/15 1137     Chief Complaint  Patient presents with  . Chest Pain     (Consider location/radiation/quality/duration/timing/severity/associated sxs/prior Treatment) HPI  59 year old female presents with chest pain since waking up from a colonoscopy yesterday afternoon. Has been gradually progressing. Points to her lower chest/upper abdomen as the source of pain. Relieved when she gets up and walks around. No shortness of breath but the pain worsens with deep inspiration. No fevers or cough. Has not had any vomiting. Tried Tylenol with no significant relief. Called the gastroenterologist recommended she come to the ER.  Past Medical History  Diagnosis Date  . Thyroid disease     hypothyroid  . Endometriosis   . Basal cell carcinoma 2005,2006    Chest-Leg  . Seasonal allergies   . Glaucoma   . Allergy     mild respiratory allergies  . Arthritis     in neck, thumbs, knee and eyes    Past Surgical History  Procedure Laterality Date  . Vaginal hysterectomy  1996    [facility] BSO  . Pelvic laparoscopy      Endometriosis  . Basal cell carcinoma excision  2005,2006    Chest-Leg  . Plantarfaciotomy  2006  . Oophorectomy    . Knee surgery      Arthroscopic   Family History  Problem Relation Age of Onset  . Diabetes Mother   . Hypertension Mother   . Heart disease Mother   . Uterine cancer Mother   . Dementia Mother   . Thyroid disease Mother   . Colon polyps Mother   . Hypertension Father   . Heart disease Father   . Diabetes Maternal Grandfather   . Hypertension Maternal Grandfather   . Heart disease Paternal Grandfather   . Hypertension Paternal Grandfather   . Glaucoma Maternal Grandmother   . Hypertension Maternal Grandmother   . Hypertension Sister   . Thyroid disease Sister   . Cancer Maternal Uncle   . Hypertension Paternal Grandmother   . Heart disease  Paternal Grandmother   . Colon cancer Neg Hx    Social History  Substance Use Topics  . Smoking status: Former Research scientist (life sciences)  . Smokeless tobacco: Never Used  . Alcohol Use: Yes     Comment: social-Rare   OB History    Gravida Para Term Preterm AB TAB SAB Ectopic Multiple Living   2    2     0     Review of Systems  Constitutional: Negative for fever.  Respiratory: Negative for shortness of breath.   Cardiovascular: Positive for chest pain.  Gastrointestinal: Negative for vomiting and abdominal pain.  All other systems reviewed and are negative.     Allergies  Codeine; Amoxicillin-pot clavulanate; and Sulfonamide derivatives  Home Medications   Prior to Admission medications   Medication Sig Start Date End Date Taking? Authorizing Provider  AMINO ACIDS PO Take by mouth daily.   Yes Historical Provider, MD  amphetamine-dextroamphetamine (ADDERALL) 30 MG tablet Take 30 mg by mouth daily.   Yes Historical Provider, MD  ARMOUR THYROID 90 MG tablet take 1 tablet by mouth daily 08/01/14  Yes Anastasio Auerbach, MD  B Complex Vitamins (VITAMIN B COMPLEX PO) Take by mouth.   Yes Historical Provider, MD  BIOTIN PO Take 1 capsule by mouth as needed (hair, skin, nails take 2 to 3 times  per week).    Yes Historical Provider, MD  Calcium Carbonate-Vitamin D (CALCIUM + D PO) Take by mouth daily.     Yes Historical Provider, MD  CASCARA SAGRADA PO Take by mouth daily.   Yes Historical Provider, MD  CHROMIUM PICOLINATE PO Take by mouth daily.   Yes Historical Provider, MD  CLIMARA 0.05 MG/24HR patch APPLY 1 PATCH ONCE A WEEK AS DIRECTED 06/13/14  Yes Anastasio Auerbach, MD  Coenzyme Q10 (COQ10) 100 MG CAPS Take 1 capsule by mouth daily.   Yes Historical Provider, MD  Cyanocobalamin (VITAMIN B-12 CR PO) Take by mouth.   Yes Historical Provider, MD  DHEA 50 MG CAPS Take 2 capsules by mouth daily.   Yes Historical Provider, MD  DIGESTIVE ENZYMES PO Take by mouth daily.   Yes Historical Provider, MD   Docusate Calcium (STOOL SOFTENER PO) Take by mouth as needed.   Yes Historical Provider, MD  Fexofenadine HCl (ALLEGRA PO)    Yes Historical Provider, MD  FOLIC ACID PO Take by mouth daily.   Yes Historical Provider, MD  latanoprost (XALATAN) 0.005 % ophthalmic solution Place 1 drop into both eyes at bedtime.    Yes Historical Provider, MD  MOVIPREP 100 G SOLR Take 1 Dose by mouth once. 06/06/15  Yes Historical Provider, MD  Naproxen Sodium (ALEVE PO) Take 1 tablet by mouth as needed (knee pain).    Yes Historical Provider, MD  Omega-3 Fatty Acids (OMEGA-3 FISH OIL PO) Take by mouth daily.   Yes Historical Provider, MD  Probiotic Product (PROBIOTIC DAILY PO) Take by mouth daily.   Yes Historical Provider, MD  Simethicone (GAS-X PO) Take 1 tablet by mouth as needed (gas).    Yes Historical Provider, MD  Triamcinolone Acetonide (NASACORT ALLERGY 24HR NA) Place 1 spray into the nose at bedtime.    Yes Historical Provider, MD   BP 117/64 mmHg  Temp(Src) 97.8 F (36.6 C) (Oral)  Resp 18  SpO2 100% Physical Exam  Constitutional: She is oriented to person, place, and time. She appears well-developed and well-nourished. No distress.  HENT:  Head: Normocephalic and atraumatic.  Right Ear: External ear normal.  Left Ear: External ear normal.  Nose: Nose normal.  Eyes: Right eye exhibits no discharge. Left eye exhibits no discharge.  Cardiovascular: Normal rate, regular rhythm and normal heart sounds.   Pulmonary/Chest: Effort normal and breath sounds normal. She has no wheezes. She has no rales.  Abdominal: Soft. There is tenderness (mild) in the epigastric area.  Neurological: She is alert and oriented to person, place, and time.  Skin: Skin is warm and dry. She is not diaphoretic.  Nursing note and vitals reviewed.   ED Course  Procedures (including critical care time) Labs Review Labs Reviewed  BASIC METABOLIC PANEL - Abnormal; Notable for the following:    Glucose, Bld 100 (*)    All  other components within normal limits  D-DIMER, QUANTITATIVE (NOT AT [facility]) - Abnormal; Notable for the following:    D-Dimer, Quant 0.75 (*)    All other components within normal limits  HEPATIC FUNCTION PANEL - Abnormal; Notable for the following:    Bilirubin, Direct <0.1 (*)    All other components within normal limits  CBC  LIPASE, BLOOD  I-STAT TROPOININ, ED    Imaging Review Dg Abd Acute W/chest  06/12/2015   CLINICAL DATA:  Left side cp since colonoscopy yesterday. Hx of basal cell carcinoma  EXAM: DG ABDOMEN ACUTE W/ 1V CHEST  COMPARISON:  None.  FINDINGS: Cardiomediastinal silhouette is within normal limits. The lungs are free of focal consolidations and pleural effusions.  Bowel gas pattern is nonobstructive. No free intraperitoneal air. Mild scoliosis convex left.  IMPRESSION: 1.  No evidence for acute cardiopulmonary abnormality. 2. Nonobstructive bowel gas pattern.   Electronically Signed   By: Nolon Nations M.D.   On: 06/12/2015 13:27   I have personally reviewed and evaluated these images and lab results as part of my medical decision-making.   EKG Interpretation   Date/Time:  Friday June 12 2015 11:43:23 EDT Ventricular Rate:  99 PR Interval:  161 QRS Duration: 63 QT Interval:  363 QTC Calculation: 466 R Axis:   71 Text Interpretation:  Sinus rhythm Probable left atrial enlargement  Baseline wander in lead(s) V1 V2 no acute ST/T changes No old tracing to  compare Confirmed by GOLDSTON  MD, SCOTT ([id number]) on 06/12/2015 3:13:09 PM      MDM   Final diagnoses:  None    Patient's pain is odd, very mild epigastric tenderness and lower chest pain. X-ray shows no free air which is the highest concern after a colonoscopy 24 hours ago. However the pain has been present for at least 24 hours. No hypoxia or significant vital sign abnormality. Ddimer elevated, given pleuritic pain plan to get CTA to r/o PE. If no abnormalities, plan D/C. Doubt ACS given atypical  symptoms and benign ECG and troponin after almost 24 hours. Care to Dr. Venora Maples with CT pending.    Sherwood Gambler, MD 06/12/15 [phone]

## 2015-06-12 NOTE — Discharge Instructions (Signed)
Chest Pain (Nonspecific) °It is often hard to give a specific diagnosis for the cause of chest pain. There is always a chance that your pain could be related to something serious, such as a heart attack or a blood clot in the lungs. You need to follow up with your health care provider for further evaluation. °CAUSES  °· Heartburn. °· Pneumonia or bronchitis. °· Anxiety or stress. °· Inflammation around your heart (pericarditis) or lung (pleuritis or pleurisy). °· A blood clot in the lung. °· A collapsed lung (pneumothorax). It can develop suddenly on its own (spontaneous pneumothorax) or from trauma to the chest. °· Shingles infection (herpes zoster virus). °The chest wall is composed of bones, muscles, and cartilage. Any of these can be the source of the pain. °· The bones can be bruised by injury. °· The muscles or cartilage can be strained by coughing or overwork. °· The cartilage can be affected by inflammation and become sore (costochondritis). °DIAGNOSIS  °Lab tests or other studies may be needed to find the cause of your pain. Your health care provider may have you take a test called an ambulatory electrocardiogram (ECG). An ECG records your heartbeat patterns over a 24-hour period. You may also have other tests, such as: °· Transthoracic echocardiogram (TTE). During echocardiography, sound waves are used to evaluate how blood flows through your heart. °· Transesophageal echocardiogram (TEE). °· Cardiac monitoring. This allows your health care provider to monitor your heart rate and rhythm in real time. °· Holter monitor. This is a portable device that records your heartbeat and can help diagnose heart arrhythmias. It allows your health care provider to track your heart activity for several days, if needed. °· Stress tests by exercise or by giving medicine that makes the heart beat faster. °TREATMENT  °· Treatment depends on what may be causing your chest pain. Treatment may include: °· Acid blockers for  heartburn. °· Anti-inflammatory medicine. °· Pain medicine for inflammatory conditions. °· Antibiotics if an infection is present. °· You may be advised to change lifestyle habits. This includes stopping smoking and avoiding alcohol, caffeine, and chocolate. °· You may be advised to keep your head raised (elevated) when sleeping. This reduces the chance of acid going backward from your stomach into your esophagus. °Most of the time, nonspecific chest pain will improve within 2-3 days with rest and mild pain medicine.  °HOME CARE INSTRUCTIONS  °· If antibiotics were prescribed, take them as directed. Finish them even if you start to feel better. °· For the next few days, avoid physical activities that bring on chest pain. Continue physical activities as directed. °· Do not use any tobacco products, including cigarettes, chewing tobacco, or electronic cigarettes. °· Avoid drinking alcohol. °· Only take medicine as directed by your health care provider. °· Follow your health care provider's suggestions for further testing if your chest pain does not go away. °· Keep any follow-up appointments you made. If you do not go to an appointment, you could develop lasting (chronic) problems with pain. If there is any problem keeping an appointment, call to reschedule. °SEEK MEDICAL CARE IF:  °· Your chest pain does not go away, even after treatment. °· You have a rash with blisters on your chest. °· You have a fever. °SEEK IMMEDIATE MEDICAL CARE IF:  °· You have increased chest pain or pain that spreads to your arm, neck, jaw, back, or abdomen. °· You have shortness of breath. °· You have an increasing cough, or you cough   up blood.  You have severe back or abdominal pain.  You feel nauseous or vomit.  You have severe weakness.  You faint.  You have chills. This is an emergency. Do not wait to see if the pain will go away. Get medical help at once. Call your local emergency services (911 in [place] Do not drive  yourself to the hospital. MAKE SURE YOU:   Understand these instructions.  Will watch your condition.  Will get help right away if you are not doing well or get worse. Document Released: 06/15/2005 Document Revised: 09/10/2013 Document Reviewed: 04/10/2008 [facility] Patient Information 2015 [place], [place]. This information is not intended to replace advice given to you by your health care provider. Make sure you discuss any questions you have with your health care provider. Pulmonary Nodule A pulmonary nodule is a small, round growth of tissue in the lung. Pulmonary nodules can range in size from less than 1/5 inch (4 mm) to a little bigger than an inch (25 mm). Most pulmonary nodules are detected when imaging tests of the lung are being performed for a different problem. Pulmonary nodules are usually not cancerous (benign). However, some pulmonary nodules are cancerous (malignant). Follow-up treatment or testing is based on the size of the pulmonary nodule and your risk of getting lung cancer.  CAUSES Benign pulmonary nodules can be caused by various things. Some of the causes include:   Bacterial, fungal, or viral infections. This is usually an old infection that is no longer active, but it can sometimes be a current, active infection.  A benign mass of tissue.  Inflammation from conditions such as rheumatoid arthritis.   Abnormal blood vessels in the lungs. Malignant pulmonary nodules can result from lung cancer or from cancers that spread to the lung from other places in the body. SIGNS AND SYMPTOMS Pulmonary nodules usually do not cause symptoms. DIAGNOSIS Most often, pulmonary nodules are found incidentally when an X-ray or CT scan is performed to look for some other problem in the lung area. To help determine whether a pulmonary nodule is benign or malignant, your health care provider will take a medical history and order a variety of tests. Tests done may include:   Blood  tests.  A skin test called a tuberculin test. This test is used to determine if you have been exposed to the germ that causes tuberculosis.   Chest X-rays. If possible, a new X-ray may be compared with X-rays you have had in the past.   CT scan. This test shows smaller pulmonary nodules more clearly than an X-ray.   Positron emission tomography (PET) scan. In this test, a safe amount of a radioactive substance is injected into the bloodstream. Then, the scan takes a picture of the pulmonary nodule. The radioactive substance is eliminated from your body in your urine.   Biopsy. A tiny piece of the pulmonary nodule is removed so it can be checked under a microscope. TREATMENT  Pulmonary nodules that are benign normally do not require any treatment because they usually do not cause symptoms or breathing problems. Your health care provider may want to monitor the pulmonary nodule through follow-up CT scans. The frequency of these CT scans will vary based on the size of the nodule and the risk factors for lung cancer. For example, CT scans will need to be done more frequently if the pulmonary nodule is larger and if you have a history of smoking and a family history of cancer. Further testing or  biopsies may be done if any follow-up CT scan shows that the size of the pulmonary nodule has increased. HOME CARE INSTRUCTIONS  Only take over-the-counter or prescription medicines as directed by your health care provider.  Keep all follow-up appointments with your health care provider. SEEK MEDICAL CARE IF:  You have trouble breathing when you are active.   You feel sick or unusually tired.   You do not feel like eating.   You lose weight without trying to.   You develop chills or night sweats.  SEEK IMMEDIATE MEDICAL CARE IF:  You cannot catch your breath, or you begin wheezing.   You cannot stop coughing.   You cough up blood.   You become dizzy or feel like you are going to  pass out.   You have sudden chest pain.   You have a fever or persistent symptoms for more than 2-3 days.   You have a fever and your symptoms suddenly get worse. MAKE SURE YOU:  Understand these instructions.  Will watch your condition.  Will get help right away if you are not doing well or get worse. Document Released: 07/03/2009 Document Revised: 05/08/2013 Document Reviewed: 02/25/2013 [facility] Patient Information 2015 [place], [place]. This information is not intended to replace advice given to you by your health care provider. Make sure you discuss any questions you have with your health care provider.

## 2015-06-12 NOTE — Telephone Encounter (Signed)
Spoke with patient and she will go to ED.

## 2015-06-12 NOTE — Telephone Encounter (Signed)
Left a message for patient again and requested she go to ED. Requested she call to confirm receipt of message.

## 2015-06-12 NOTE — ED Notes (Signed)
Pt with Hx of colonoscopy yesterday c/o central dull chest pain onset today aggravated by sitting/being still, relieved by exertion.

## 2015-06-15 ENCOUNTER — Other Ambulatory Visit: Payer: Self-pay

## 2015-06-16 ENCOUNTER — Encounter: Payer: Self-pay | Admitting: Gastroenterology

## 2015-06-18 ENCOUNTER — Ambulatory Visit (INDEPENDENT_AMBULATORY_CARE_PROVIDER_SITE_OTHER): Payer: BLUE CROSS/BLUE SHIELD | Admitting: Gynecology

## 2015-06-18 ENCOUNTER — Telehealth: Payer: Self-pay | Admitting: *Deleted

## 2015-06-18 ENCOUNTER — Encounter: Payer: Self-pay | Admitting: Gynecology

## 2015-06-18 VITALS — BP 124/78 | Ht 66.0 in | Wt 168.0 lb

## 2015-06-18 DIAGNOSIS — Z79890 Hormone replacement therapy (postmenopausal): Secondary | ICD-10-CM | POA: Diagnosis not present

## 2015-06-18 DIAGNOSIS — Z01419 Encounter for gynecological examination (general) (routine) without abnormal findings: Secondary | ICD-10-CM

## 2015-06-18 MED ORDER — THYROID 97.5 MG PO TABS: 97.5000 mg | ORAL_TABLET | Freq: Every day | ORAL | Status: DC

## 2015-06-18 MED ORDER — CLIMARA 0.05 MG/24HR TD PTWK: MEDICATED_PATCH | TRANSDERMAL | Status: DC

## 2015-06-18 MED ORDER — THYROID 97.5 MG PO TABS: 90.0000 mg | ORAL_TABLET | Freq: Every day | ORAL | Status: DC

## 2015-06-18 NOTE — Progress Notes (Signed)
Elaine Gray Jun 19, 1956 [id number]        59 y.o.  G2P0020 for annual exam.  Several issues noted below.  Past medical history,surgical history, problem list, medications, allergies, family history and social history were all reviewed and documented as reviewed in the EPIC chart.  ROS:  Performed with pertinent positives and negatives included in the history, assessment and plan.   Additional significant findings :  none   Exam: [name] Vitals:   06/18/15 1412  BP: 124/78  Height: 5\' 6"  (1.676 m)  Weight: 168 lb (76.204 kg)   General appearance:  Normal affect, orientation and appearance. Skin: Grossly normal HEENT: Without gross lesions.  No cervical or supraclavicular adenopathy. Thyroid normal.  Lungs:  Clear without wheezing, rales or rhonchi Cardiac: RR, without RMG Abdominal:  Soft, nontender, without masses, guarding, rebound, organomegaly or hernia Breasts:  Examined lying and sitting without masses, retractions, discharge or axillary adenopathy. Pelvic:  Ext/BUS/vagina with atrophic changes  Adnexa  Without masses or tenderness    Anus and perineum  Normal   Rectovaginal  Normal sphincter tone without palpated masses or tenderness.    Assessment/Plan:  59 y.o. G58P0020 female for annual exam.   1. Postmenopausal/HRT. Status post [facility] BSO in the past for endometriosis.Patient continues on Climara 0.05 mg patch. Doing well. Notes increasing fatigue and hot flushes whenever she is at the end of her patch week. Prefers to continue on this. I again reviewed the risks to include increased risk of stroke heart attack DVT possible breast cancer. Patient understands and accepts and I refilled her 1 year.  2. Hypothyroid. Patient's actually being seen by endocrinology now. She did ask by can refill her Armour Thyroid with a normal recent TSH. She did ask if I could slightly increase her dose and she is having some fatigue.  Was on 59 will increase to 97 mg 1 year.  She'll follow up with endocrinology for this. 3. Mammography overdo. Patient skipped last year and I strongly recommended she schedule and now she agrees to do so.  SBE monthly reviewed. 4. Pap smear 2015 normal.  No Pap smear done today.  No history of significant abnormal Pap smears. Discussed stop screening she is status post hysterectomy for benign indications versus less frequent screening intervals. Will readdress on an annual basis. 5. DEXA 2012 normal.  Plan repeat next year at five-year interval.  Vitamin D level normal last year. 6. Colonoscopy 2016. Repeat in 3 years at recommended interval. 7. Health maintenance. Has recently had blood work drawn to include her TSH comprehensive metabolic panel and CBC. Lipid profile normal last year.  Follow up in one year, sooner as needed.   Anastasio Auerbach MD, 2:36 PM 06/18/2015

## 2015-06-18 NOTE — Telephone Encounter (Signed)
Okay to switch?  

## 2015-06-18 NOTE — Telephone Encounter (Signed)
Pharmacist called stating pt Rx for Armour Thyroid 90 mg doesn't come in 97 mg, she would need to have Nature thyroid which does come in 97.5 mg. Please advise if Rx can be switched?

## 2015-06-18 NOTE — Patient Instructions (Signed)

## 2015-06-18 NOTE — Telephone Encounter (Signed)
Pharmacy informed, Rx called in.

## 2015-06-19 LAB — URINALYSIS W MICROSCOPIC + REFLEX CULTURE
BACTERIA UA: NONE SEEN [HPF]
Bilirubin Urine: NEGATIVE
CASTS: NONE SEEN [LPF]
CRYSTALS: NONE SEEN [HPF]
GLUCOSE, UA: NEGATIVE
HGB URINE DIPSTICK: NEGATIVE
Ketones, ur: NEGATIVE
LEUKOCYTES UA: NEGATIVE
Nitrite: NEGATIVE
PROTEIN: NEGATIVE
Specific Gravity, Urine: 1.011 (ref 1.001–1.035)
WBC, UA: NONE SEEN WBC/HPF (ref <=5)
YEAST: NONE SEEN [HPF]
pH: 6 (ref 5.0–8.0)

## 2015-06-21 LAB — URINE CULTURE

## 2015-06-22 ENCOUNTER — Other Ambulatory Visit: Payer: Self-pay | Admitting: Gynecology

## 2015-06-22 MED ORDER — NITROFURANTOIN MONOHYD MACRO 100 MG PO CAPS: 100.0000 mg | ORAL_CAPSULE | Freq: Two times a day (BID) | ORAL | Status: DC

## 2015-07-24 ENCOUNTER — Ambulatory Visit: Payer: BLUE CROSS/BLUE SHIELD | Admitting: Gastroenterology

## 2015-08-04 ENCOUNTER — Encounter: Payer: Self-pay | Admitting: Gastroenterology

## 2015-08-04 ENCOUNTER — Ambulatory Visit (INDEPENDENT_AMBULATORY_CARE_PROVIDER_SITE_OTHER): Payer: BLUE CROSS/BLUE SHIELD | Admitting: Gastroenterology

## 2015-08-04 VITALS — BP 148/86 | HR 104 | Ht 65.75 in | Wt 166.5 lb

## 2015-08-04 DIAGNOSIS — K5900 Constipation, unspecified: Secondary | ICD-10-CM

## 2015-08-04 DIAGNOSIS — K5909 Other constipation: Secondary | ICD-10-CM

## 2015-08-04 DIAGNOSIS — D126 Benign neoplasm of colon, unspecified: Secondary | ICD-10-CM | POA: Diagnosis not present

## 2015-08-04 DIAGNOSIS — R140 Abdominal distension (gaseous): Secondary | ICD-10-CM | POA: Diagnosis not present

## 2015-08-04 DIAGNOSIS — R0789 Other chest pain: Secondary | ICD-10-CM

## 2015-08-04 MED ORDER — LINACLOTIDE 145 MCG PO CAPS: 145.0000 ug | ORAL_CAPSULE | Freq: Every day | ORAL | Status: DC

## 2015-08-04 NOTE — Progress Notes (Signed)
HPI :  59 y/o female here for reassessment for constipation. She has recently underwent a colonoscopy in September in which multiple colon polyps were removed.   The patient reports she had been to the ER for chest pain after the colonoscopy. They evaluated her with blood work and CT chest which was negative other than pulmonary nodules. She was told her symptoms were likely due to GERD. She reports this also may have been due to costochondritis. She has not had chest pain since the ER visit. She was given a week of prilosec and then stopped taking it. She reports no recurrence of chest pain since the ER visit.   She otherwise has longstanding constipation at baseline bothering her for years. She takes a variety of herbal products to help her with her bowels including [place], "herbal laxative". Magnesium supplement, other herbal supplements, docusate sodium, artemisia, and herbal pumpkin. She rotates these supplements to help her have a BM. She has been on this regimen for a long time. She has a bowel movement once every day or so. She takes "smooth move" tea which can help her have a bowel movement as well. She also takes alovera juice. She feels gas / bloating which continues to bother her. She is taking psylllium every morning for the past week. She has used Miralax in the past and drank a "bottle of it" which did not do anything. No manual maneuvers to help with a bowel movement. No sinificant straining. She is interested in trying something else for her constipation as she doesn't wish to take all of these supplement.  She has not tried Linzess or Amitiza. She has not had prior anorectal manometry.   Colonoscopy 05/2015 - 5 of 9 polyps removed c/w adenomas / sessile serrated, with melanosis coli throughout  Past Medical History  Diagnosis Date  . Thyroid disease     hypothyroid  . Endometriosis   . Basal cell carcinoma 2005,2006    Chest-Leg  . Seasonal allergies   . Glaucoma   .  Allergy     mild respiratory allergies  . Arthritis     in neck, thumbs, knee and eyes   . Colon polyps     tubular adenomatous  . ADD (attention deficit disorder)   . Diverticulosis   . Chronic constipation      Past Surgical History  Procedure Laterality Date  . Vaginal hysterectomy  1996    [facility] BSO  . Pelvic laparoscopy      Endometriosis  . Basal cell carcinoma excision  2005,2006    Chest-Leg  . Plantarfaciotomy  2006  . Oophorectomy    . Knee surgery      Arthroscopic   Family History  Problem Relation Age of Onset  . Diabetes Mother   . Hypertension Mother   . Heart disease Mother   . Uterine cancer Mother   . Dementia Mother   . Thyroid disease Mother   . Colon polyps Mother   . Hypertension Father   . Heart disease Father   . Diabetes Maternal Grandfather   . Hypertension Maternal Grandfather   . Heart disease Paternal Grandfather   . Hypertension Paternal Grandfather   . Glaucoma Maternal Grandmother   . Hypertension Maternal Grandmother   . Hypertension Sister   . Thyroid disease Sister   . Cancer Maternal Uncle     Pancreatic  . Hypertension Paternal Grandmother   . Heart disease Paternal Grandmother   . Colon cancer Neg  Hx    Social History  Substance Use Topics  . Smoking status: Former Research scientist (life sciences)  . Smokeless tobacco: Never Used  . Alcohol Use: 0.0 oz/week    0 Standard drinks or equivalent per week     Comment: social-Rare   Current Outpatient Prescriptions  Medication Sig Dispense Refill  . AMINO ACIDS PO Take by mouth daily.    Marland Kitchen amphetamine-dextroamphetamine (ADDERALL) 30 MG tablet Take 30 mg by mouth daily.    . B Complex Vitamins (VITAMIN B COMPLEX PO) Take by mouth.    Marland Kitchen BIOTIN PO Take 1 capsule by mouth as needed (hair, skin, nails take 2 to 3 times per week).     . Calcium Carbonate-Vitamin D (CALCIUM + D PO) Take by mouth daily.      . CASCARA SAGRADA PO Take by mouth daily.    . CHROMIUM PICOLINATE PO Take by mouth daily.    Marland Kitchen  CLIMARA 0.05 MG/24HR patch APPLY 1 PATCH ONCE A WEEK AS DIRECTED 4 patch 12  . Coenzyme Q10 (COQ10) 100 MG CAPS Take 1 capsule by mouth daily.    . Cyanocobalamin (VITAMIN B-12 CR PO) Take by mouth.    Marland Kitchen DHEA 50 MG CAPS Take 2 capsules by mouth daily.    Marland Kitchen DIGESTIVE ENZYMES PO Take by mouth daily.    Mariane Baumgarten Calcium (STOOL SOFTENER PO) Take by mouth as needed.    Marland Kitchen Fexofenadine HCl (ALLEGRA PO) Take 90 mg by mouth daily.     Marland Kitchen FOLIC ACID PO Take by mouth daily.    Marland Kitchen latanoprost (XALATAN) 0.005 % ophthalmic solution Place 1 drop into both eyes at bedtime.     . Naproxen Sodium (ALEVE PO) Take 1 tablet by mouth as needed (knee pain).     . Omega-3 Fatty Acids (OMEGA-3 FISH OIL PO) Take by mouth daily.    . Probiotic Product (PROBIOTIC DAILY PO) Take by mouth daily.    . Simethicone (GAS-X PO) Take 1 tablet by mouth as needed (gas).     . Thyroid 97.5 MG TABS Take 1 tablet (97.5 mg total) by mouth daily. 30 tablet 11  . Triamcinolone Acetonide (NASACORT ALLERGY 24HR NA) Place 1 spray into the nose at bedtime.     . Linaclotide (LINZESS) 145 MCG CAPS capsule Take 1 capsule (145 mcg total) by mouth daily. 60 capsule 3   No current facility-administered medications for this visit.   Allergies  Allergen Reactions  . Codeine Swelling  . Amoxicillin-Pot Clavulanate Diarrhea and Other (See Comments)    Has patient had a PCN reaction causing immediate rash, facial/tongue/throat swelling, SOB or lightheadedness with hypotension: No Has patient had a PCN reaction causing severe rash involving mucus membranes or skin necrosis: No Has patient had a PCN reaction that required hospitalization No Has patient had a PCN reaction occurring within the last 10 years: No If all of the above answers are "NO", then may proceed with Cephalosporin use.  Stomach pain  . Sulfonamide Derivatives      Review of Systems: All systems reviewed and negative except where noted in HPI.   Lab Results  Component  Value Date   WBC 7.3 06/12/2015   HGB 14.5 06/12/2015   HCT 42.2 06/12/2015   MCV 93.4 06/12/2015   PLT 272 06/12/2015    Lab Results  Component Value Date   ALT 30 06/12/2015   AST 23 06/12/2015   ALKPHOS 66 06/12/2015   BILITOT 0.6 06/12/2015    Lab Results  Component Value Date   CREATININE 0.74 06/12/2015   BUN 11 06/12/2015   NA 138 06/12/2015   K 3.8 06/12/2015   CL 107 06/12/2015   CO2 23 06/12/2015     Physical Exam: BP 148/86 mmHg  Pulse 104  Ht 5' 5.75" (1.67 m)  Wt 166 lb 8 oz (75.524 kg)  BMI 27.08 kg/m2 Constitutional: Pleasant,well-developed, female in no acute distress. HEENT: Normocephalic and atraumatic. Conjunctivae are normal. No scleral icterus. Neck supple.  Cardiovascular: Normal rate, regular rhythm.  Pulmonary/chest: Effort normal and breath sounds normal. No wheezing, rales or rhonchi. Abdominal: Soft, nondistended, mild mid lower abdominal TTP without rebound or guarding. Bowel sounds active throughout. There are no masses palpable. No hepatomegaly. Extremities: no edema Lymphadenopathy: No cervical adenopathy noted. Neurological: Alert and oriented to person place and time. Skin: Skin is warm and dry. No rashes noted. Psychiatric: Normal mood and affect. Behavior is normal.   ASSESSMENT AND PLAN: 59 y/o female with chronic constipation over years, using multiple herbal supplements as outlined above to help her symptoms. Prior use of Miralax and other laxatives have not provided relief. Colonoscopy recently showed melanosis coli. She has not had anorectal manometry. I discussed the utility of this test to rule out pelvic floor / outlet dysfunction and offered this to her regarding her chronic symptoms, however she prefers to hold off on this at present time. I otherwise discussed other options for constipation at this time. Following our discussion I am recommending she try Linzess, we will start at 12mcg po q day and see how she does. She can  increase to twice daily if needed over time. She will try this and stop herbal supplements. Otherwise, regarding her bloating, I suspect improvement in her constipation will help with this, but also provided education on low FODMOP diet to see if this helps.   Otherwise, I did not realize she was in the ER following her colonoscopy, she had noncardiac chest pain of unclear etiology. Perhaps due to reflux due to positioning from the colonoscopy, unclear. Symptoms have resolved, she can follow up as needed if they recur. Of note, chest CT done per ER staff showed pulmonary nodules. She needs to have a repeat chest CT in the upcoming months to reassess these, she will touch base with her PCM to coordinate getting this done. She verbalized understanding and agreed with the plan.   Finally, given multiple polyps removed on last colonoscopy, due for a recall surveillance exam to be done in 3 years.   [place] Cellar, MD [facility] Pager [phone]

## 2015-08-04 NOTE — Patient Instructions (Signed)
We have sent the following medications to your pharmacy for you to pick up at your convenience: Linzess 140mcg  Please start Low FODMAP diet.

## 2016-03-05 ENCOUNTER — Encounter (HOSPITAL_COMMUNITY): Payer: Self-pay | Admitting: Emergency Medicine

## 2016-03-05 ENCOUNTER — Emergency Department (HOSPITAL_COMMUNITY)
Admission: EM | Admit: 2016-03-05 | Discharge: 2016-03-06 | Disposition: A | Discharge status: Home / Self Care | Payer: PRIVATE HEALTH INSURANCE | Attending: Emergency Medicine | Admitting: Emergency Medicine

## 2016-03-05 DIAGNOSIS — Y93G3 Activity, cooking and baking: Secondary | ICD-10-CM | POA: Insufficient documentation

## 2016-03-05 DIAGNOSIS — Z87891 Personal history of nicotine dependence: Secondary | ICD-10-CM | POA: Diagnosis not present

## 2016-03-05 DIAGNOSIS — M1990 Unspecified osteoarthritis, unspecified site: Secondary | ICD-10-CM | POA: Insufficient documentation

## 2016-03-05 DIAGNOSIS — W268XXA Contact with other sharp object(s), not elsewhere classified, initial encounter: Secondary | ICD-10-CM | POA: Diagnosis not present

## 2016-03-05 DIAGNOSIS — Y929 Unspecified place or not applicable: Secondary | ICD-10-CM | POA: Insufficient documentation

## 2016-03-05 DIAGNOSIS — Y999 Unspecified external cause status: Secondary | ICD-10-CM | POA: Insufficient documentation

## 2016-03-05 DIAGNOSIS — S61011A Laceration without foreign body of right thumb without damage to nail, initial encounter: Secondary | ICD-10-CM

## 2016-03-05 MED ORDER — LIDOCAINE HCL 2 % IJ SOLN
INTRAMUSCULAR | Status: AC
  Filled 2016-03-05: qty 20

## 2016-03-05 MED ORDER — TETANUS-DIPHTH-ACELL PERTUSSIS 5-2.5-18.5 LF-MCG/0.5 IM SUSP
0.5000 mL | Freq: Once | INTRAMUSCULAR | Status: AC
  Administered 2016-03-05: 0.5 mL via INTRAMUSCULAR
  Filled 2016-03-05: qty 0.5

## 2016-03-05 MED ORDER — LIDOCAINE HCL 1 % IJ SOLN
5.0000 mL | Freq: Once | INTRAMUSCULAR | Status: AC
  Administered 2016-03-05: 5 mL via INTRADERMAL

## 2016-03-05 NOTE — ED Notes (Signed)
Patient complain of pain in right thumb. Patient was cooking and end up slicing her finger.

## 2016-03-05 NOTE — ED Provider Notes (Signed)
CSN: [id number]     Arrival date & time 03/05/16  2149 History  By signing my name below, I, Elaine Gray, attest that this documentation has been prepared under the direction and in the presence of  Margarita Mail, PA-C. Electronically Signed: Hansel Gray, ED Scribe. 03/05/2016. 11:36 PM      Chief Complaint  Patient presents with  . Extremity Laceration    The history is provided by the patient. No language interpreter was used.    HPI Comments: Elaine Gray is a 60 y.o. female who presents to the Emergency Department complaining of a laceration to the right thumb that occurred at 8:30pm this evening. Per pt, she was using a sharp vegetable grater when her thumb was cut. She states that she washed the wound with water, applied liquid band-aid, and a pressure dressing PTA, which temporarily controlled the bleeding. Tdap out of date. Pt takes qd omega 3 vitamins. She denies weakness, numbness, additional injuries.    Past Medical History  Diagnosis Date  . Thyroid disease     hypothyroid  . Endometriosis   . Basal cell carcinoma 2005,2006    Chest-Leg  . Seasonal allergies   . Glaucoma   . Allergy     mild respiratory allergies  . Arthritis     in neck, thumbs, knee and eyes   . Colon polyps     tubular adenomatous  . ADD (attention deficit disorder)   . Diverticulosis   . Chronic constipation    Past Surgical History  Procedure Laterality Date  . Vaginal hysterectomy  1996    [facility] BSO  . Pelvic laparoscopy      Endometriosis  . Basal cell carcinoma excision  2005,2006    Chest-Leg  . Plantarfaciotomy  2006  . Oophorectomy    . Knee surgery      Arthroscopic   Family History  Problem Relation Age of Onset  . Diabetes Mother   . Hypertension Mother   . Heart disease Mother   . Uterine cancer Mother   . Dementia Mother   . Thyroid disease Mother   . Colon polyps Mother   . Hypertension Father   . Heart disease Father   . Diabetes Maternal Grandfather   .  Hypertension Maternal Grandfather   . Heart disease Paternal Grandfather   . Hypertension Paternal Grandfather   . Glaucoma Maternal Grandmother   . Hypertension Maternal Grandmother   . Hypertension Sister   . Thyroid disease Sister   . Cancer Maternal Uncle     Pancreatic  . Hypertension Paternal Grandmother   . Heart disease Paternal Grandmother   . Colon cancer Neg Hx    Social History  Substance Use Topics  . Smoking status: Former Research scientist (life sciences)  . Smokeless tobacco: Never Used  . Alcohol Use: 0.0 oz/week    0 Standard drinks or equivalent per week     Comment: social-Rare   OB History    Gravida Para Term Preterm AB TAB SAB Ectopic Multiple Living   2    2     0     Review of Systems  Skin: Positive for wound.  Neurological: Negative for weakness and numbness.    Allergies  Codeine; Amoxicillin-pot clavulanate; and Sulfonamide derivatives  Home Medications   Prior to Admission medications   Medication Sig Start Date End Date Taking? Authorizing Provider  AMINO ACIDS PO Take by mouth daily.    Historical Provider, MD  amphetamine-dextroamphetamine (ADDERALL) 30 MG tablet Take  30 mg by mouth daily.    Historical Provider, MD  B Complex Vitamins (VITAMIN B COMPLEX PO) Take by mouth.    Historical Provider, MD  BIOTIN PO Take 1 capsule by mouth as needed (hair, skin, nails take 2 to 3 times per week).     Historical Provider, MD  Calcium Carbonate-Vitamin D (CALCIUM + D PO) Take by mouth daily.      Historical Provider, MD  CASCARA SAGRADA PO Take by mouth daily.    Historical Provider, MD  CHROMIUM PICOLINATE PO Take by mouth daily.    Historical Provider, MD  CLIMARA 0.05 MG/24HR patch APPLY 1 PATCH ONCE A WEEK AS DIRECTED 06/18/15   Anastasio Auerbach, MD  Coenzyme Q10 (COQ10) 100 MG CAPS Take 1 capsule by mouth daily.    Historical Provider, MD  Cyanocobalamin (VITAMIN B-12 CR PO) Take by mouth.    Historical Provider, MD  DHEA 50 MG CAPS Take 2 capsules by mouth daily.     Historical Provider, MD  DIGESTIVE ENZYMES PO Take by mouth daily.    Historical Provider, MD  Docusate Calcium (STOOL SOFTENER PO) Take by mouth as needed.    Historical Provider, MD  Fexofenadine HCl (ALLEGRA PO) Take 90 mg by mouth daily.     Historical Provider, MD  FOLIC ACID PO Take by mouth daily.    Historical Provider, MD  latanoprost (XALATAN) 0.005 % ophthalmic solution Place 1 drop into both eyes at bedtime.     Historical Provider, MD  Linaclotide Rolan Lipa) 145 MCG CAPS capsule Take 1 capsule (145 mcg total) by mouth daily. 08/04/15   Manus Gunning, MD  Naproxen Sodium (ALEVE PO) Take 1 tablet by mouth as needed (knee pain).     Historical Provider, MD  Omega-3 Fatty Acids (OMEGA-3 FISH OIL PO) Take by mouth daily.    Historical Provider, MD  Probiotic Product (PROBIOTIC DAILY PO) Take by mouth daily.    Historical Provider, MD  Simethicone (GAS-X PO) Take 1 tablet by mouth as needed (gas).     Historical Provider, MD  Thyroid 97.5 MG TABS Take 1 tablet (97.5 mg total) by mouth daily. 06/18/15   Anastasio Auerbach, MD  Triamcinolone Acetonide (NASACORT ALLERGY 24HR NA) Place 1 spray into the nose at bedtime.     Historical Provider, MD   BP 148/67 mmHg  Pulse 86  Temp(Src) 97.8 F (36.6 C) (Oral)  Resp 16  Ht 5\' 7"  (1.702 m)  Wt 166 lb (75.297 kg)  BMI 25.99 kg/m2  SpO2 100% Physical Exam  Constitutional: She appears well-developed and well-nourished.  HENT:  Head: Normocephalic.  Eyes: Conjunctivae are normal.  Cardiovascular: Normal rate.   Pulmonary/Chest: Effort normal. No respiratory distress.  Abdominal: She exhibits no distension.  Musculoskeletal: Normal range of motion.  2 cm superficial laceration to the right thumb pad. FROM.   Neurological: She is alert.  Sensation intact distally. Sensation to all other digits of the right hand intact.   Skin: Skin is warm and dry.  Psychiatric: She has a normal mood and affect. Her behavior is normal.  Nursing  note and vitals reviewed.   ED Course  Procedures (including critical care time) DIAGNOSTIC STUDIES: Oxygen Saturation is 100% on RA, normal by my interpretation.    COORDINATION OF CARE: 10:57 PM Discussed treatment plan with pt at bedside which includes laceration repair and pt agreed to plan.  LACERATION REPAIR PROCEDURE NOTE The patient's identification was confirmed and consent was obtained. This  procedure was performed by Margarita Mail, PA-C at 11:34 PM. Site: right thumb pad  Sterile procedures observed Anesthetic used (type and amt): 2 mL 2% lidocaine without epi Suture type/size:5-0 vicryl rapide  Length: 2 cm  # of Sutures: 1 Technique: simple interrupted  Complexity: simple Antibx ointment applied Tetanus ordered Site anesthetized, irrigated with NS, explored without evidence of foreign body, wound well approximated, site covered with dry, sterile dressing.  Patient tolerated procedure well without complications. Instructions for care discussed verbally and patient provided with additional written instructions for homecare and f/u.     MDM   Final diagnoses:  Thumb laceration, right, initial encounter    Tdap booster given.Pressure irrigation performed. Laceration occurred < 8 hours prior to repair which was well tolerated. Pt has no co morbidities to effect normal wound healing. Discussed suture home care w pt and answered question Pt is hemodynamically stable w no complaints prior to dc.    I personally performed the services described in this documentation, which was scribed in my presence. The recorded information has been reviewed and is accurate.     Margarita Mail, PA-C 03/07/16 0150  Harvel Quale, MD 03/09/16 [phone]

## 2016-03-06 NOTE — Discharge Instructions (Signed)
KEEP YOUR BANDAGE ON FOUR 24 HOURS. RETURN FOR BLEEDING THAT SATURATES YOUR DRESSING. APPLY DIRECT PRESSURE FOR 20 MINUTES AS DISCUSSED.  WOUND CARE   Keep area clean and dry for 24 hours. Do not remove bandage, if applied.  After 24 hours, remove bandage and wash wound gently with mild soap and warm water. Reapply a new bandage after cleaning wound, if directed.  Continue daily cleansing with soap and water until stitches/staples are removed.  Do not apply any ointments or creams to the wound while stitches/staples are in place, as this may cause delayed healing.  Seek medical careif you experience any of the following signs of infection: Swelling, redness, pus drainage, streaking, fever >101.0 F  Seek care if you experience excessive bleeding that does not stop after 15-20 minutes of constant, firm pressure.   Laceration Care, Adult A laceration is a cut that goes through all of the layers of the skin and into the tissue that is right under the skin. Some lacerations heal on their own. Others need to be closed with stitches (sutures), staples, skin adhesive strips, or skin glue. Proper laceration care minimizes the risk of infection and helps the laceration to heal better. HOW TO CARE FOR YOUR LACERATION If sutures or staples were used:  Keep the wound clean and dry.  If you were given a bandage (dressing), you should change it at least one time per day or as told by your health care provider. You should also change it if it becomes wet or dirty.  Keep the wound completely dry for the first 24 hours or as told by your health care provider. After that time, you may shower or bathe. However, make sure that the wound is not soaked in water until after the sutures or staples have been removed.  Clean the wound one time each day or as told by your health care provider:  Wash the wound with soap and water.  Rinse the wound with water to remove all soap.  Pat the wound dry  with a clean towel. Do not rub the wound.  After cleaning the wound, apply a thin layer of antibiotic ointmentas told by your health care provider. This will help to prevent infection and keep the dressing from sticking to the wound.  Have the sutures or staples removed as told by your health care provider. If skin adhesive strips were used:  Keep the wound clean and dry.  If you were given a bandage (dressing), you should change it at least one time per day or as told by your health care provider. You should also change it if it becomes dirty or wet.  Do not get the skin adhesive strips wet. You may shower or bathe, but be careful to keep the wound dry.  If the wound gets wet, pat it dry with a clean towel. Do not rub the wound.  Skin adhesive strips fall off on their own. You may trim the strips as the wound heals. Do not remove skin adhesive strips that are still stuck to the wound. They will fall off in time. If skin glue was used:  Try to keep the wound dry, but you may briefly wet it in the shower or bath. Do not soak the wound in water, such as by swimming.  After you have showered or bathed, gently pat the wound dry with a clean towel. Do not rub the wound.  Do not do any activities that will make you sweat heavily until  the skin glue has fallen off on its own.  Do not apply liquid, cream, or ointment medicine to the wound while the skin glue is in place. Using those may loosen the film before the wound has healed.  If you were given a bandage (dressing), you should change it at least one time per day or as told by your health care provider. You should also change it if it becomes dirty or wet.  If a dressing is placed over the wound, be careful not to apply tape directly over the skin glue. Doing that may cause the glue to be pulled off before the wound has healed.  Do not pick at the glue. The skin glue usually remains in place for 5-10 days, then it falls off of the  skin. General Instructions  Take over-the-counter and prescription medicines only as told by your health care provider.  If you were prescribed an antibiotic medicine or ointment, take or apply it as told by your doctor. Do not stop using it even if your condition improves.  To help prevent scarring, make sure to cover your wound with sunscreen whenever you are outside after stitches are removed, after adhesive strips are removed, or when glue remains in place and the wound is healed. Make sure to wear a sunscreen of at least 30 SPF.  Do not scratch or pick at the wound.  Keep all follow-up visits as told by your health care provider. This is important.  Check your wound every day for signs of infection. Watch for:  Redness, swelling, or pain.  Fluid, blood, or pus.  Raise (elevate) the injured area above the level of your heart while you are sitting or lying down, if possible. SEEK MEDICAL CARE IF:  You received a tetanus shot and you have swelling, severe pain, redness, or bleeding at the injection site.  You have a fever.  A wound that was closed breaks open.  You notice a bad smell coming from your wound or your dressing.  You notice something coming out of the wound, such as wood or glass.  Your pain is not controlled with medicine.  You have increased redness, swelling, or pain at the site of your wound.  You have fluid, blood, or pus coming from your wound.  You notice a change in the color of your skin near your wound.  You need to change the dressing frequently due to fluid, blood, or pus draining from the wound.  You develop a new rash.  You develop numbness around the wound. SEEK IMMEDIATE MEDICAL CARE IF:  You develop severe swelling around the wound.  Your pain suddenly increases and is severe.  You develop painful lumps near the wound or on skin that is anywhere on your body.  You have a red streak going away from your wound.  The wound is on your  hand or foot and you cannot properly move a finger or toe.  The wound is on your hand or foot and you notice that your fingers or toes look pale or bluish.   This information is not intended to replace advice given to you by your health care provider. Make sure you discuss any questions you have with your health care provider.   Document Released: 09/05/2005 Document Revised: 01/20/2015 Document Reviewed: 09/01/2014 Elsevier Interactive Patient Education [employer].

## 2016-04-05 ENCOUNTER — Other Ambulatory Visit: Payer: Self-pay | Admitting: Gynecology

## 2016-04-12 ENCOUNTER — Encounter: Payer: Self-pay | Admitting: Gynecology

## 2016-06-07 ENCOUNTER — Telehealth: Payer: Self-pay

## 2016-06-07 NOTE — Telephone Encounter (Signed)
Note received from pharmacy reads "Petra Kuba Thyroid is currently on back order. Please provide alternative".

## 2016-06-07 NOTE — Telephone Encounter (Signed)
What does the pharmacist suggest as an equivalent

## 2016-06-08 MED ORDER — LEVOTHYROXINE SODIUM 75 MCG PO TABS: 75.0000 ug | ORAL_TABLET | Freq: Every day | ORAL | 0 refills | Status: DC

## 2016-06-08 NOTE — Telephone Encounter (Signed)
Recommend Synthroid 75 g #30 one by mouth daily. I will check her TSH when she comes in for her annual.

## 2016-06-08 NOTE — Telephone Encounter (Signed)
Pharmacist said there is not really an equivalent. She recommended using the lab result and prescribing Synthroid or Armour as you normally would for a patient with that result.

## 2016-06-16 ENCOUNTER — Other Ambulatory Visit: Payer: Self-pay | Admitting: Gynecology

## 2016-06-20 ENCOUNTER — Ambulatory Visit (INDEPENDENT_AMBULATORY_CARE_PROVIDER_SITE_OTHER): Payer: No Typology Code available for payment source | Admitting: Gynecology

## 2016-06-20 ENCOUNTER — Encounter: Payer: Self-pay | Admitting: Gynecology

## 2016-06-20 VITALS — BP 120/74 | Ht 66.0 in | Wt 175.0 lb

## 2016-06-20 DIAGNOSIS — Z79890 Hormone replacement therapy (postmenopausal): Secondary | ICD-10-CM

## 2016-06-20 DIAGNOSIS — Z01419 Encounter for gynecological examination (general) (routine) without abnormal findings: Secondary | ICD-10-CM | POA: Diagnosis not present

## 2016-06-20 DIAGNOSIS — R5383 Other fatigue: Secondary | ICD-10-CM | POA: Diagnosis not present

## 2016-06-20 DIAGNOSIS — N952 Postmenopausal atrophic vaginitis: Secondary | ICD-10-CM | POA: Diagnosis not present

## 2016-06-20 DIAGNOSIS — E039 Hypothyroidism, unspecified: Secondary | ICD-10-CM | POA: Diagnosis not present

## 2016-06-20 LAB — TSH: TSH: 2.99 mIU/L

## 2016-06-20 MED ORDER — THYROID 113.75 MG PO TABS: 1.0000 | ORAL_TABLET | Freq: Every day | ORAL | 11 refills | Status: DC

## 2016-06-20 MED ORDER — ESTRADIOL 0.075 MG/24HR TD PTWK: 0.0750 mg | MEDICATED_PATCH | TRANSDERMAL | 12 refills | Status: DC

## 2016-06-20 NOTE — Patient Instructions (Signed)
Schedule your mammogram  You may obtain a copy of any labs that were done today by logging onto MyChart as outlined in the instructions provided with your AVS (after visit summary). The office will not call with normal lab results but certainly if there are any significant abnormalities then we will contact you.   Health Maintenance Adopting a healthy lifestyle and getting preventive care can go a long way to promote health and wellness. Talk with your health care provider about what schedule of regular examinations is right for you. This is a good chance for you to check in with your provider about disease prevention and staying healthy. In between checkups, there are plenty of things you can do on your own. Experts have done a lot of research about which lifestyle changes and preventive measures are most likely to keep you healthy. Ask your health care provider for more information. WEIGHT AND DIET  Eat a healthy diet  Be sure to include plenty of vegetables, fruits, low-fat dairy products, and lean protein.  Do not eat a lot of foods high in solid fats, added sugars, or salt.  Get regular exercise. This is one of the most important things you can do for your health.  Most adults should exercise for at least 150 minutes each week. The exercise should increase your heart rate and make you sweat (moderate-intensity exercise).  Most adults should also do strengthening exercises at least twice a week. This is in addition to the moderate-intensity exercise.  Maintain a healthy weight  Body mass index (BMI) is a measurement that can be used to identify possible weight problems. It estimates body fat based on height and weight. Your health care provider can help determine your BMI and help you achieve or maintain a healthy weight.  For females 20 years of age and older:   A BMI below 18.5 is considered underweight.  A BMI of 18.5 to 24.9 is normal.  A BMI of 25 to 29.9 is considered  overweight.  A BMI of 30 and above is considered obese.  Watch levels of cholesterol and blood lipids  You should start having your blood tested for lipids and cholesterol at 62 years of age, then have this test every 5 years.  You may need to have your cholesterol levels checked more often if:  Your lipid or cholesterol levels are high.  You are older than 62 years of age.  You are at high risk for heart disease.  CANCER SCREENING   Lung Cancer  Lung cancer screening is recommended for adults 55-80 years old who are at high risk for lung cancer because of a history of smoking.  A yearly low-dose CT scan of the lungs is recommended for people who:  Currently smoke.  Have quit within the past 15 years.  Have at least a 30-pack-year history of smoking. A pack year is smoking an average of one pack of cigarettes a day for 1 year.  Yearly screening should continue until it has been 15 years since you quit.  Yearly screening should stop if you develop a health problem that would prevent you from having lung cancer treatment.  Breast Cancer  Practice breast self-awareness. This means understanding how your breasts normally appear and feel.  It also means doing regular breast self-exams. Let your health care provider know about any changes, no matter how small.  If you are in your 20s or 30s, you should have a clinical breast exam (CBE) by a health   care provider every 1-3 years as part of a regular health exam.  If you are 76 or older, have a CBE every year. Also consider having a breast X-ray (mammogram) every year.  If you have a family history of breast cancer, talk to your health care provider about genetic screening.  If you are at high risk for breast cancer, talk to your health care provider about having an MRI and a mammogram every year.  Breast cancer gene (BRCA) assessment is recommended for women who have family members with BRCA-related cancers. BRCA-related  cancers include:  Breast.  Ovarian.  Tubal.  Peritoneal cancers.  Results of the assessment will determine the need for genetic counseling and BRCA1 and BRCA2 testing. Cervical Cancer Routine pelvic examinations to screen for cervical cancer are no longer recommended for nonpregnant women who are considered low risk for cancer of the pelvic organs (ovaries, uterus, and vagina) and who do not have symptoms. A pelvic examination may be necessary if you have symptoms including those associated with pelvic infections. Ask your health care provider if a screening pelvic exam is right for you.   The Pap test is the screening test for cervical cancer for women who are considered at risk.  If you had a hysterectomy for a problem that was not cancer or a condition that could lead to cancer, then you no longer need Pap tests.  If you are older than 65 years, and you have had normal Pap tests for the past 10 years, you no longer need to have Pap tests.  If you have had past treatment for cervical cancer or a condition that could lead to cancer, you need Pap tests and screening for cancer for at least 20 years after your treatment.  If you no longer get a Pap test, assess your risk factors if they change (such as having a new sexual partner). This can affect whether you should start being screened again.  Some women have medical problems that increase their chance of getting cervical cancer. If this is the case for you, your health care provider may recommend more frequent screening and Pap tests.  The human papillomavirus (HPV) test is another test that may be used for cervical cancer screening. The HPV test looks for the virus that can cause cell changes in the cervix. The cells collected during the Pap test can be tested for HPV.  The HPV test can be used to screen women 64 years of age and older. Getting tested for HPV can extend the interval between normal Pap tests from three to five  years.  An HPV test also should be used to screen women of any age who have unclear Pap test results.  After 62 years of age, women should have HPV testing as often as Pap tests.  Colorectal Cancer  This type of cancer can be detected and often prevented.  Routine colorectal cancer screening usually begins at 62 years of age and continues through 62 years of age.  Your health care provider may recommend screening at an earlier age if you have risk factors for colon cancer.  Your health care provider may also recommend using home test kits to check for hidden blood in the stool.  A small camera at the end of a tube can be used to examine your colon directly (sigmoidoscopy or colonoscopy). This is done to check for the earliest forms of colorectal cancer.  Routine screening usually begins at age 14.  Direct examination of the  colon should be repeated every 5-10 years through 62 years of age. However, you may need to be screened more often if early forms of precancerous polyps or small growths are found. Skin Cancer  Check your skin from head to toe regularly.  Tell your health care provider about any new moles or changes in moles, especially if there is a change in a mole's shape or color.  Also tell your health care provider if you have a mole that is larger than the size of a pencil eraser.  Always use sunscreen. Apply sunscreen liberally and repeatedly throughout the day.  Protect yourself by wearing long sleeves, pants, a wide-brimmed hat, and sunglasses whenever you are outside. HEART DISEASE, DIABETES, AND HIGH BLOOD PRESSURE   Have your blood pressure checked at least every 1-2 years. High blood pressure causes heart disease and increases the risk of stroke.  If you are between 57 years and 49 years old, ask your health care provider if you should take aspirin to prevent strokes.  Have regular diabetes screenings. This involves taking a blood sample to check your fasting  blood sugar level.  If you are at a normal weight and have a low risk for diabetes, have this test once every three years after 62 years of age.  If you are overweight and have a high risk for diabetes, consider being tested at a younger age or more often. PREVENTING INFECTION  Hepatitis B  If you have a higher risk for hepatitis B, you should be screened for this virus. You are considered at high risk for hepatitis B if:  You were born in a country where hepatitis B is common. Ask your health care provider which countries are considered high risk.  Your parents were born in a high-risk country, and you have not been immunized against hepatitis B (hepatitis B vaccine).  You have HIV or AIDS.  You use needles to inject street drugs.  You live with someone who has hepatitis B.  You have had sex with someone who has hepatitis B.  You get hemodialysis treatment.  You take certain medicines for conditions, including cancer, organ transplantation, and autoimmune conditions. Hepatitis C  Blood testing is recommended for:  Everyone born from [birth year] through 1967.  Anyone with known risk factors for hepatitis C. Sexually transmitted infections (STIs)  You should be screened for sexually transmitted infections (STIs) including gonorrhea and chlamydia if:  You are sexually active and are younger than 62 years of age.  You are older than 62 years of age and your health care provider tells you that you are at risk for this type of infection.  Your sexual activity has changed since you were last screened and you are at an increased risk for chlamydia or gonorrhea. Ask your health care provider if you are at risk.  If you do not have HIV, but are at risk, it may be recommended that you take a prescription medicine daily to prevent HIV infection. This is called pre-exposure prophylaxis (PrEP). You are considered at risk if:  You are sexually active and do not regularly use condoms or know  the HIV status of your partner(s).  You take drugs by injection.  You are sexually active with a partner who has HIV. Talk with your health care provider about whether you are at high risk of being infected with HIV. If you choose to begin PrEP, you should first be tested for HIV. You should then be tested every 3 months  for as long as you are taking PrEP.  PREGNANCY   If you are premenopausal and you may become pregnant, ask your health care provider about preconception counseling.  If you may become pregnant, take 400 to 800 micrograms (mcg) of folic acid every day.  If you want to prevent pregnancy, talk to your health care provider about birth control (contraception). OSTEOPOROSIS AND MENOPAUSE   Osteoporosis is a disease in which the bones lose minerals and strength with aging. This can result in serious bone fractures. Your risk for osteoporosis can be identified using a bone density scan.  If you are 65 years of age or older, or if you are at risk for osteoporosis and fractures, ask your health care provider if you should be screened.  Ask your health care provider whether you should take a calcium or vitamin D supplement to lower your risk for osteoporosis.  Menopause may have certain physical symptoms and risks.  Hormone replacement therapy may reduce some of these symptoms and risks. Talk to your health care provider about whether hormone replacement therapy is right for you.  HOME CARE INSTRUCTIONS   Schedule regular health, dental, and eye exams.  Stay current with your immunizations.   Do not use any tobacco products including cigarettes, chewing tobacco, or electronic cigarettes.  If you are pregnant, do not drink alcohol.  If you are breastfeeding, limit how much and how often you drink alcohol.  Limit alcohol intake to no more than 1 drink per day for nonpregnant women. One drink equals 12 ounces of beer, 5 ounces of wine, or 1 ounces of hard liquor.  Do not  use street drugs.  Do not share needles.  Ask your health care provider for help if you need support or information about quitting drugs.  Tell your health care provider if you often feel depressed.  Tell your health care provider if you have ever been abused or do not feel safe at home. Document Released: 03/21/2011 Document Revised: 01/20/2014 Document Reviewed: 08/07/2013 Exit[employer] Patient Information 2015 [employer]. This information is not intended to replace advice given to you by your health care provider. Make sure you discuss any questions you have with your health care provider.  

## 2016-06-20 NOTE — Progress Notes (Signed)
    Elaine Gray Jun 08, 1956 [id number]        60 y.o.  G2P0020  for annual exam.  Several issues noted below.  Past medical history,surgical history, problem list, medications, allergies, family history and social history were all reviewed and documented as reviewed in the EPIC chart.  ROS:  Performed with pertinent positives and negatives included in the history, assessment and plan.   Additional significant findings :  None   Exam: [name] assistant Vitals:   06/20/16 1536  BP: 120/74  Weight: 175 lb (79.4 kg)  Height: 5\' 6"  (1.676 m)   Body mass index is 28.25 kg/m.  General appearance:  Normal affect, orientation and appearance. Skin: Grossly normal HEENT: Without gross lesions.  No cervical or supraclavicular adenopathy. Thyroid normal.  Lungs:  Clear without wheezing, rales or rhonchi Cardiac: RR, without RMG Abdominal:  Soft, nontender, without masses, guarding, rebound, organomegaly or hernia Breasts:  Examined lying and sitting without masses, retractions, discharge or axillary adenopathy. Pelvic:  Ext, BUS, Vagina normal with mild atrophic changes  Adnexa without masses or tenderness    Anus and perineum normal   Rectovaginal normal sphincter tone without palpated masses or tenderness.    Assessment/Plan:  60 y.o. G37P0020 female for annual exam.   1. Postmenopausal/HRT. Status post TVH BSO in the past for endometriosis. She is on Climara 0.05 mg patches. I reviewed the most current N[facility] 2017 HRT guidelines. Benefits as far as symptom relief, possible cardiovascular and bone health when he started early as well as risks to include thrombosis such as stroke heart attack DVT and possible breast cancer all reviewed. Patient is battling some weight gain and fatigue. Had been seen by endocrinologist and had been on Armour Thyroid and and thyroid natural in the past. The endocrinologist did not like these choices and heparin Synthroid but she did not like the way  it made her feel and has to by would prescribed a 113 mg thyroid natural. I went ahead and did so #30 with 11 refills. Check TSH today. Also discussed possibly increasing her HRT to see if it does make her feel better and the patient is interested in trying this and I prescribed the 0.075 mg patch.  Refill 1 year provided. 2. Hypothyroid as above. Thyroid natural 113 mg tablet prescribed 1 year. Check TSH now. 3. Mammography overdue as her last mammogram was 04/2013. Patient knows she is overdue and promises to schedule. SBE monthly reviewed. 4. Pap smear 2015. No Pap smear done today. No history of significant abnormal Pap smears. Options to stop screening as she is status post hysterectomy for benign indications reviewed per current screening guidelines. Will readdress on annual basis. 5. Colonoscopy 2016. Repeat at their recommended interval. 6. Health maintenance. No routine lab work done as patient reports this done elsewhere. Follow up in one year, sooner as needed.  Additional time in excess of her routine gynecologic exam was spent in direct face to face counseling and coordination of care in regards to her problems of HRT with review of new guidelines and her complaints of fatigue and thyroid management.    Anastasio Auerbach MD, 4:18 PM 06/20/2016

## 2016-06-22 ENCOUNTER — Telehealth: Payer: Self-pay

## 2016-06-22 MED ORDER — THYROID 120 MG PO TABS: 120.0000 mg | ORAL_TABLET | Freq: Every day | ORAL | 12 refills | Status: DC

## 2016-06-22 NOTE — Telephone Encounter (Signed)
Prescription for Armour thyroid 120 mg put through.

## 2016-06-22 NOTE — Telephone Encounter (Signed)
Pharmacy sent a note in regards to her Thyroid 113.75 mg tab.  "This is currently on backorder. All strength of Nature Thyroid. Please provide alternative. Armour Thyroid is available. Thanks".

## 2016-09-29 ENCOUNTER — Inpatient Hospital Stay (HOSPITAL_COMMUNITY): Admission: RE | Admit: 2016-09-29 | Payer: No Typology Code available for payment source | Source: Ambulatory Visit

## 2016-10-06 ENCOUNTER — Ambulatory Visit: Payer: BLUE CROSS/BLUE SHIELD | Admitting: Interventional Cardiology

## 2016-10-09 NOTE — Progress Notes (Signed)
Cardiology Office Note   Date:  10/10/2016   ID:  Elaine Gray, DOB June 28, 1956, MRN [id number]  PCP:  Antony Blackbird, MD    Chief Complaint  Patient presents with  . Establish Care     Wt Readings from Last 3 Encounters:  10/10/16 176 lb 12.8 oz (80.2 kg)  06/20/16 175 lb (79.4 kg)  03/05/16 166 lb (75.3 kg)       History of Present Illness: Elaine Gray is a 61 y.o. female  Who was supposed to have knee surgery today.  She injured her knee in July 2017 and was going to need TKR.  She was put on Mobic.  She started to note leg swelling getting worse after that, but she cannot remember exactly when.  She had significant swelling and this improved with stopping Mobic.    She has gained weight over the last few years.  She has had a lot of stress.  She continues to walk up stairs.  She works in the yard.  SHe gets more swelling if she does any prolonged walking.  No CP or SHOB with walking up stairs.  She has an extensive family history of heart disease.  She stopped smoking several years ago.    She checks BP at home and it is 112/70-118/74.   She took a natural diuretic. She does not want to take Lasix to help with her swelling.    Past Medical History:  Diagnosis Date  . ADD (attention deficit disorder)   . Allergy    mild respiratory allergies  . Arthritis    in neck, thumbs, knee and eyes   . Basal cell carcinoma 2005,2006   Chest-Leg  . Chronic constipation   . Colon polyps    tubular adenomatous  . Diverticulosis   . Endometriosis   . Glaucoma   . Seasonal allergies   . Thyroid disease    hypothyroid    Past Surgical History:  Procedure Laterality Date  . BASAL CELL CARCINOMA EXCISION  2005,2006   Chest-Leg  . KNEE SURGERY     Arthroscopic  . OOPHORECTOMY    . PELVIC LAPAROSCOPY     Endometriosis  . Plantarfaciotomy  2006  . VAGINAL HYSTERECTOMY  1996   LAVH BSO     Current Outpatient Prescriptions  Medication Sig Dispense  Refill  . Alpha-D-Galactosidase (ECK FOOD ENZYME PO) Take 1 capsule by mouth every evening. Food Enzyme Compound    . AMINO ACIDS PO Take 1 capsule by mouth daily.     Marland Kitchen amphetamine-dextroamphetamine (ADDERALL XR) 30 MG 24 hr capsule Take 30 mg by mouth daily.  0  . B Complex Vitamins (VITAMIN B COMPLEX PO) Take 1 capsule by mouth daily.     Marland Kitchen BEE POLLEN-GINSENG PO Take 1 capsule by mouth daily. Energy Max Bee Pollen/Ginseng & Beet Root    . BIOTIN ULTRA STRENGTH PO Take 1 capsule by mouth 2 (two) times daily. Biotin/Collagen/ Vitamin C/CLA    . Boswellia Serrata (BOSWELLIA PO) Take 1 capsule by mouth daily as needed (for arthritis pain).    Carin Hock Iron (PERFECT IRON) 25 MG TABS Take 25 mg by mouth daily.    . CASCARA SAGRADA PO Take 1 capsule by mouth every evening.     . Cholecalciferol (VITAMIN D) 2000 units CAPS Take 2,000 Units by mouth See admin instructions. (2-3x's weekly)    . Coenzyme Q10 (COQ10) 100 MG CAPS Take 100 mg by mouth daily.     Marland Kitchen  COMBIGAN 0.2-0.5 % ophthalmic solution Place 1 drop into the left eye 2 (two) times daily.  0  . CREATINE PO Take 250 mg by mouth See admin instructions. (2-3x's weekly)    . Cyanocobalamin (VITAMIN B-12 CR) 1000 MCG TBCR Take 1,000 mcg by mouth daily.    Marland Kitchen estradiol (CLIMARA) 0.075 mg/24hr patch Place 1 patch (0.075 mg total) onto the skin once a week. (Patient taking differently: Place 0.075 mg onto the skin every Wednesday. ) 4 patch 12  . fexofenadine (ALLEGRA) 180 MG tablet Take 90 mg by mouth daily.    Marland Kitchen FOLIC ACID PO Take 123XX123 mcg by mouth See admin instructions. (2-3x's weekly)    . latanoprost (XALATAN) 0.005 % ophthalmic solution Place 1 drop into both eyes at bedtime.     . magnesium oxide (MAG-OX) 400 MG tablet Take 400 mg by mouth every evening.    . Misc Natural Products (ADRENAL PO) Take 1 capsule by mouth See admin instructions. CHINESE FORMULA ADRENAL SUPPORT (2-3x's weekly)    . Nutritional Supplements (DHEA PO) Take 100 mg by  mouth daily.    Ernestine Conrad 3-6-9 Fatty Acids (OMEGA-3-6-9 PO) Take 1 capsule by mouth See admin instructions. (3-4x's weekly)    . OVER THE COUNTER MEDICATION Take 1 capsule by mouth daily. Master Gland    . Probiotic Product (PROBIOTIC DAILY PO) Take 1 capsule by mouth every evening. Probiotic 50 billion Compound    . Quercetin 250 MG TABS Take 250 mg by mouth See admin instructions. (2-3x's weekly)    . SENNA PO Take 1 tablet by mouth daily as needed (for constipation).    . sodium chloride (OCEAN) 0.65 % SOLN nasal spray Place 1 spray into both nostrils at bedtime.    Marland Kitchen thyroid (ARMOUR THYROID) 120 MG tablet Take 1 tablet (120 mg total) by mouth daily before breakfast. 30 tablet 12  . traMADol (ULTRAM) 50 MG tablet Take 50 mg by mouth every 8 (eight) hours as needed for pain. for pain  0  . triamcinolone (NASACORT ALLERGY 24HR) 55 MCG/ACT AERO nasal inhaler Place 2 sprays into the nose daily as needed (for congestion/allergies (2-3x's a week)).     No current facility-administered medications for this visit.     Allergies:   Codeine; Amoxicillin-pot clavulanate; Other; and Sulfonamide derivatives    Social History:  The patient  reports that she has quit smoking. She has never used smokeless tobacco. She reports that she drinks alcohol. She reports that she does not use drugs.   Family History:  The patient's family history includes Cancer in her maternal uncle; Colon polyps in her mother; Dementia in her mother; Diabetes in her maternal grandfather and mother; Glaucoma in her maternal grandmother; Heart disease in her father, mother, paternal grandfather, and paternal grandmother; Hypertension in her father, maternal grandfather, maternal grandmother, mother, paternal grandfather, paternal grandmother, and sister; Thyroid disease in her mother and sister; Uterine cancer in her mother. Sisters have not had heart trouble.   ROS:  Please see the history of present illness.   Otherwise, review of  systems are positive for leg sewelling.   All other systems are reviewed and negative.    PHYSICAL EXAM: VS:  BP (!) 150/84   Pulse 94   Ht 5\' 7"  (1.702 m)   Wt 176 lb 12.8 oz (80.2 kg)   SpO2 98%   BMI 27.69 kg/m  , BMI Body mass index is 27.69 kg/m. GEN: Well nourished, well developed, in no acute  distress  HEENT: normal  Neck: no JVD, carotid bruits, or masses Cardiac: RRR; no murmurs, rubs, or gallops, bilateral pitting edema L>R Respiratory:  clear to auscultation bilaterally, normal work of breathing GI: soft, nontender, nondistended, + BS MS: no deformity or atrophy  Skin: warm and dry, no rash Neuro:  Strength and sensation are intact Psych: euthymic mood, full affect   EKG:   The ekg ordered today demonstrates normal sinus rhythm, no ST segment changes   Recent Labs: 06/20/2016: TSH 2.99   Lipid Panel    Component Value Date/Time   CHOL 179 06/13/2014 0930   TRIG 73 06/13/2014 0930   HDL 72 06/13/2014 0930   CHOLHDL 2.5 06/13/2014 0930   VLDL 15 06/13/2014 0930   LDLCALC 92 06/13/2014 0930     Other studies Reviewed: Additional studies/ records that were reviewed today with results demonstrating: none.   ASSESSMENT AND PLAN:  1. Preoperative cardiovascular assessment: No signs of angina. No ischemic changes on ECG. She does have evidence of fluid overload. She has a family history that includes congestive heart failure. Will check echocardiogram. By exam, her heart sounds are normal and I am not suspecting any significant structural abnormalities. If her echo is normal, there will not be any further cardiac testing needed before knee surgery.  2. She is concerned about fatigue and weight gain. She has been much less active over the last few years and has some dyspnea on exertion which is new compared to when she was active. 3. We spoke about reducing carbohydrate intake to help lose weight. 4. Elevated blood pressure reading today. Normally it is controlled.  She states she had some stress in terms of getting to our office. She went to the wrong office. She will follow her blood pressure as an outpatient. 5. Edema: Elevate legs. Try to minimize salt. I offered her Lasix but she declined. I suspect some of her edema is coming from the knee inflammation.  May have been related to NSAID.   Current medicines are reviewed at length with the patient today.  The patient concerns regarding her medicines were addressed.  The following changes have been made:  No change  Labs/ tests ordered today include:   Orders Placed This Encounter  Procedures  . EKG 12-Lead    Recommend 150 minutes/week of aerobic exercise Once she has recovered from knee surgery Low carb, high fiber diet recommended  Disposition:   FU For echocardiogram   Signed, Larae Grooms, MD  10/10/2016 4:01 PM    [facility] [place], [place], [place]  [address] Phone: [phone]; Fax: [phone]

## 2016-10-10 ENCOUNTER — Encounter: Payer: Self-pay | Admitting: Interventional Cardiology

## 2016-10-10 ENCOUNTER — Encounter (HOSPITAL_COMMUNITY): Admission: RE | Payer: Self-pay | Source: Ambulatory Visit

## 2016-10-10 ENCOUNTER — Ambulatory Visit (INDEPENDENT_AMBULATORY_CARE_PROVIDER_SITE_OTHER): Payer: BLUE CROSS/BLUE SHIELD | Admitting: Interventional Cardiology

## 2016-10-10 ENCOUNTER — Inpatient Hospital Stay (HOSPITAL_COMMUNITY)
Admission: RE | Admit: 2016-10-10 | Payer: BLUE CROSS/BLUE SHIELD | Source: Ambulatory Visit | Admitting: Orthopedic Surgery

## 2016-10-10 VITALS — BP 150/84 | HR 94 | Ht 67.0 in | Wt 176.8 lb

## 2016-10-10 DIAGNOSIS — Z01810 Encounter for preprocedural cardiovascular examination: Secondary | ICD-10-CM

## 2016-10-10 DIAGNOSIS — R0602 Shortness of breath: Secondary | ICD-10-CM

## 2016-10-10 DIAGNOSIS — R600 Localized edema: Secondary | ICD-10-CM | POA: Diagnosis not present

## 2016-10-10 DIAGNOSIS — Z7689 Persons encountering health services in other specified circumstances: Secondary | ICD-10-CM

## 2016-10-10 SURGERY — ARTHROPLASTY, KNEE, TOTAL
Anesthesia: General | Laterality: Left

## 2016-10-10 NOTE — Patient Instructions (Signed)
**Note De-Identified  Obfuscation** Medication Instructions:  Same-no changes  Labwork: None  Testing/Procedures: Your physician has requested that you have an echocardiogram. Echocardiography is a painless test that uses sound waves to create images of your heart. It provides your doctor with information about the size and shape of your heart and how well your heart's chambers and valves are working. This procedure takes approximately one hour. There are no restrictions for this procedure.  Follow-Up: As needed     If you need a refill on your cardiac medications before your next appointment, please call your pharmacy.

## 2016-10-11 ENCOUNTER — Telehealth: Payer: Self-pay | Admitting: *Deleted

## 2016-10-11 NOTE — Telephone Encounter (Signed)
Pt takes climara 0.075 mg would like to decrease to lower dose, was taking climara 0.05 mg states with this higher dose she has breast tenderness and fluid retention. Please advise

## 2016-10-11 NOTE — Telephone Encounter (Signed)
Okay to decrease to 0.05 with refills through next scheduled annual exam

## 2016-10-12 MED ORDER — ESTRADIOL 0.05 MG/24HR TD PTWK: 0.0500 mg | MEDICATED_PATCH | TRANSDERMAL | 8 refills | Status: DC

## 2016-10-12 NOTE — Telephone Encounter (Signed)
Pt informed, Rx sent. 

## 2016-10-18 ENCOUNTER — Ambulatory Visit (HOSPITAL_COMMUNITY)
Admission: RE | Admit: 2016-10-18 | Discharge: 2016-10-18 | Disposition: A | Discharge status: Home / Self Care | Payer: BLUE CROSS/BLUE SHIELD | Source: Ambulatory Visit | Attending: Interventional Cardiology | Admitting: Interventional Cardiology

## 2016-10-18 DIAGNOSIS — I351 Nonrheumatic aortic (valve) insufficiency: Secondary | ICD-10-CM | POA: Insufficient documentation

## 2016-10-18 DIAGNOSIS — R0602 Shortness of breath: Secondary | ICD-10-CM | POA: Diagnosis not present

## 2016-10-18 DIAGNOSIS — R0600 Dyspnea, unspecified: Secondary | ICD-10-CM | POA: Diagnosis present

## 2016-10-18 LAB — ECHOCARDIOGRAM COMPLETE
CHL CUP DOP CALC LVOT VTI: 22.5 cm
E/e' ratio: 10.82
EWDT: 132 ms
FS: 38 % (ref 28–44)
IVS/LV PW RATIO, ED: 0.98
LA ID, A-P, ES: 31 mm
LAVOL: 44 mL
LAVOLA4C: 40 mL
LDCA: 3.14 cm2
LEFT ATRIUM END SYS DIAM: 31 mm
LV E/e' medial: 10.82
LV E/e'average: 10.82
LV PW d: 9.27 mm — AB (ref 0.6–1.1)
LV TDI E'LATERAL: 7.94
LV TDI E'MEDIAL: 7.07
LV e' LATERAL: 7.94 cm/s
LVOT peak grad rest: 7 mmHg
LVOTD: 20 mm
LVOTPV: 131 cm/s
LVOTSV: 71 mL
MV Dec: 132
MV pk A vel: 117 m/s
MV pk E vel: 85.9 m/s
MVPG: 3 mmHg
P 1/2 time: 219 ms
RV LATERAL S' VELOCITY: 12.4 cm/s
RV TAPSE: 26.1 mm

## 2016-10-18 NOTE — Progress Notes (Signed)
  Echocardiogram 2D Echocardiogram has been performed.  Elaine Gray 10/18/2016, 4:04 PM

## 2016-10-19 ENCOUNTER — Telehealth: Payer: Self-pay | Admitting: Interventional Cardiology

## 2016-10-19 NOTE — Telephone Encounter (Signed)
Left a message for the pt to call back to receive echo results per Dr Irish Lack.

## 2016-10-19 NOTE — Telephone Encounter (Signed)
Elaine Gray is requesting a call back in regards to her echocardiogram results. Please call, thanks.

## 2016-10-19 NOTE — Telephone Encounter (Signed)
Pt calling in to receive her echo results from 1/30, ordered by Dr Irish Lack   Pt states that she is waiting on her echo results, pending clearance for knee replacement surgery.   Pt had this done on 1/30.   Informed the pt that there has been a preliminary read done on her echo, but Dr Irish Lack has not reviewed this and given his final interpretation.   Informed the pt that Dr Irish Lack and Nurse are both out of the office today, but I will route this message to them for further review and follow-up, after echo resulted.   Pt verbalized understanding and agrees with this plan.

## 2016-10-19 NOTE — Telephone Encounter (Signed)
I sent a note to triage regarding her echo result.

## 2016-10-20 ENCOUNTER — Telehealth: Payer: Self-pay | Admitting: Interventional Cardiology

## 2016-10-20 NOTE — Telephone Encounter (Signed)
Patient is returning call for echo results. Thanks.

## 2016-10-20 NOTE — Telephone Encounter (Signed)
Pt is aware of echo results. Pt wanted to know if the surgeon knows if she is clear to have her knee surgery. Pt was made aware that  Its possible that the surgeon's office had requested a written clearance.

## 2016-10-21 NOTE — Telephone Encounter (Signed)
The pt was advised of results on 2/1. See phone note.

## 2016-10-26 ENCOUNTER — Telehealth: Payer: Self-pay | Admitting: Interventional Cardiology

## 2016-10-26 NOTE — Telephone Encounter (Signed)
New message      Request for surgical clearance:  1. What type of surgery is being performed?  Total knee replacement  When is this surgery scheduled? Pending clearance Are there any medications that need to be held prior to surgery and how long? Need cardiac clearance.  Can pt have procedure outpatient or should she have inpatient? Name of physician performing surgery?  Dr Noemi Chapel  What is your office phone and fax number? Fax [phone]

## 2016-10-26 NOTE — Telephone Encounter (Signed)
**Note De-identified  Obfuscation** Please advise 

## 2016-10-26 NOTE — Telephone Encounter (Signed)
No further cardiac testing needed before knee surgery. She does not take any allopathic anticoagulants.  She takes several herbal medicines. Surgery does not need to be done in hospital for any cardiac reason.

## 2016-10-27 NOTE — Telephone Encounter (Signed)
Clearance sent to Dr. Archie Endo office

## 2016-11-01 ENCOUNTER — Ambulatory Visit: Payer: No Typology Code available for payment source | Admitting: Cardiology

## 2016-12-12 ENCOUNTER — Inpatient Hospital Stay (HOSPITAL_COMMUNITY): Admit: 2016-12-12 | Payer: BLUE CROSS/BLUE SHIELD | Admitting: Orthopedic Surgery

## 2016-12-12 ENCOUNTER — Encounter (HOSPITAL_COMMUNITY): Payer: Self-pay

## 2016-12-12 SURGERY — ARTHROPLASTY, KNEE, TOTAL
Anesthesia: Spinal | Site: Knee | Laterality: Left

## 2017-02-01 ENCOUNTER — Encounter: Payer: Self-pay | Admitting: Gynecology

## 2017-05-08 ENCOUNTER — Other Ambulatory Visit: Payer: Self-pay | Admitting: Orthopedic Surgery

## 2017-05-08 DIAGNOSIS — M542 Cervicalgia: Secondary | ICD-10-CM

## 2017-05-08 DIAGNOSIS — M5412 Radiculopathy, cervical region: Secondary | ICD-10-CM

## 2017-05-19 ENCOUNTER — Other Ambulatory Visit: Payer: BLUE CROSS/BLUE SHIELD

## 2017-05-24 ENCOUNTER — Other Ambulatory Visit: Payer: BLUE CROSS/BLUE SHIELD

## 2017-06-08 ENCOUNTER — Telehealth: Payer: Self-pay | Admitting: *Deleted

## 2017-06-08 DIAGNOSIS — Z1321 Encounter for screening for nutritional disorder: Secondary | ICD-10-CM

## 2017-06-08 DIAGNOSIS — Z1329 Encounter for screening for other suspected endocrine disorder: Secondary | ICD-10-CM

## 2017-06-08 DIAGNOSIS — Z01419 Encounter for gynecological examination (general) (routine) without abnormal findings: Secondary | ICD-10-CM

## 2017-06-08 DIAGNOSIS — Z13220 Encounter for screening for lipoid disorders: Secondary | ICD-10-CM

## 2017-06-08 NOTE — Telephone Encounter (Signed)
Pt has annual scheduled on 07/19/17 would like labs done prior to annual exam. Please advise

## 2017-06-08 NOTE — Telephone Encounter (Signed)
Okay for CBC, comprehensive metabolic panel, lipid profile, TSH, vitamin D

## 2017-06-08 NOTE — Telephone Encounter (Signed)
Left message on pt voicemail lab orders placed. Pt can contact front desk to schedule lab appointment.

## 2017-06-14 ENCOUNTER — Other Ambulatory Visit: Payer: BLUE CROSS/BLUE SHIELD

## 2017-06-29 ENCOUNTER — Encounter: Payer: Self-pay | Admitting: Gynecology

## 2017-06-30 ENCOUNTER — Other Ambulatory Visit: Payer: BLUE CROSS/BLUE SHIELD

## 2017-06-30 DIAGNOSIS — Z13220 Encounter for screening for lipoid disorders: Secondary | ICD-10-CM

## 2017-06-30 DIAGNOSIS — Z1329 Encounter for screening for other suspected endocrine disorder: Secondary | ICD-10-CM

## 2017-06-30 DIAGNOSIS — Z01419 Encounter for gynecological examination (general) (routine) without abnormal findings: Secondary | ICD-10-CM

## 2017-06-30 DIAGNOSIS — Z1321 Encounter for screening for nutritional disorder: Secondary | ICD-10-CM

## 2017-07-01 LAB — VITAMIN D 25 HYDROXY (VIT D DEFICIENCY, FRACTURES): VIT D 25 HYDROXY: 27 ng/mL — AB (ref 30–100)

## 2017-07-01 LAB — CBC WITH DIFFERENTIAL/PLATELET
BASOS ABS: 59 {cells}/uL (ref 0–200)
Basophils Relative: 0.9 %
EOS PCT: 3.2 %
Eosinophils Absolute: 211 cells/uL (ref 15–500)
HCT: 41.4 % (ref 35.0–45.0)
Hemoglobin: 14.3 g/dL (ref 11.7–15.5)
Lymphs Abs: 2620 cells/uL (ref 850–3900)
MCH: 31.2 pg (ref 27.0–33.0)
MCHC: 34.5 g/dL (ref 32.0–36.0)
MCV: 90.2 fL (ref 80.0–100.0)
MPV: 11.7 fL (ref 7.5–12.5)
Monocytes Relative: 8.2 %
NEUTROS PCT: 48 %
Neutro Abs: 3168 cells/uL (ref 1500–7800)
Platelets: 257 10*3/uL (ref 140–400)
RBC: 4.59 10*6/uL (ref 3.80–5.10)
RDW: 12.1 % (ref 11.0–15.0)
Total Lymphocyte: 39.7 %
WBC mixed population: 541 cells/uL (ref 200–950)
WBC: 6.6 10*3/uL (ref 3.8–10.8)

## 2017-07-01 LAB — COMPREHENSIVE METABOLIC PANEL
AG Ratio: 1.7 (calc) (ref 1.0–2.5)
ALKALINE PHOSPHATASE (APISO): 84 U/L (ref 33–130)
ALT: 33 U/L — AB (ref 6–29)
AST: 25 U/L (ref 10–35)
Albumin: 4.1 g/dL (ref 3.6–5.1)
BILIRUBIN TOTAL: 0.6 mg/dL (ref 0.2–1.2)
BUN: 14 mg/dL (ref 7–25)
CALCIUM: 9 mg/dL (ref 8.6–10.4)
CO2: 25 mmol/L (ref 20–32)
Chloride: 106 mmol/L (ref 98–110)
Creat: 0.74 mg/dL (ref 0.50–0.99)
Globulin: 2.4 g/dL (calc) (ref 1.9–3.7)
Glucose, Bld: 99 mg/dL (ref 65–99)
Potassium: 4.3 mmol/L (ref 3.5–5.3)
Sodium: 141 mmol/L (ref 135–146)
Total Protein: 6.5 g/dL (ref 6.1–8.1)

## 2017-07-01 LAB — LIPID PANEL
CHOLESTEROL: 216 mg/dL — AB (ref <200)
HDL: 74 mg/dL (ref >50)
LDL CHOLESTEROL (CALC): 119 mg/dL — AB
Non-HDL Cholesterol (Calc): 142 mg/dL (calc) — ABNORMAL HIGH (ref <130)
TRIGLYCERIDES: 119 mg/dL (ref <150)
Total CHOL/HDL Ratio: 2.9 (calc) (ref <5.0)

## 2017-07-01 LAB — TSH: TSH: 1.14 mIU/L (ref 0.40–4.50)

## 2017-07-04 ENCOUNTER — Other Ambulatory Visit: Payer: Self-pay | Admitting: Gynecology

## 2017-07-04 DIAGNOSIS — E559 Vitamin D deficiency, unspecified: Secondary | ICD-10-CM

## 2017-07-13 ENCOUNTER — Other Ambulatory Visit: Payer: Self-pay | Admitting: Gynecology

## 2017-07-19 ENCOUNTER — Encounter: Payer: Self-pay | Admitting: Gynecology

## 2017-07-19 ENCOUNTER — Ambulatory Visit (INDEPENDENT_AMBULATORY_CARE_PROVIDER_SITE_OTHER): Payer: BLUE CROSS/BLUE SHIELD | Admitting: Gynecology

## 2017-07-19 VITALS — BP 124/78 | Ht 66.0 in | Wt 176.0 lb

## 2017-07-19 DIAGNOSIS — R1031 Right lower quadrant pain: Secondary | ICD-10-CM

## 2017-07-19 DIAGNOSIS — R229 Localized swelling, mass and lump, unspecified: Secondary | ICD-10-CM

## 2017-07-19 DIAGNOSIS — Z79890 Hormone replacement therapy (postmenopausal): Secondary | ICD-10-CM

## 2017-07-19 DIAGNOSIS — N952 Postmenopausal atrophic vaginitis: Secondary | ICD-10-CM | POA: Diagnosis not present

## 2017-07-19 DIAGNOSIS — Z01411 Encounter for gynecological examination (general) (routine) with abnormal findings: Secondary | ICD-10-CM | POA: Diagnosis not present

## 2017-07-19 DIAGNOSIS — E039 Hypothyroidism, unspecified: Secondary | ICD-10-CM | POA: Diagnosis not present

## 2017-07-19 MED ORDER — ESTRADIOL 0.05 MG/24HR TD PTWK: 0.0500 mg | MEDICATED_PATCH | TRANSDERMAL | 4 refills | Status: DC

## 2017-07-19 MED ORDER — THYROID 120 MG PO TABS: ORAL_TABLET | ORAL | 12 refills | Status: DC

## 2017-07-19 NOTE — Progress Notes (Signed)
Elaine Gray 1956-06-22 [id number]        61 y.o.  G2P0020 for annual gynecologic exam.  Patient also complaining of:  1. Small nodule upper inner left arm that she noticed several weeks ago when she was folding her arms.  Not painful and never noticed this before. 2. Right lower quadrant discomfort with aching in nature starting approximately 3 weeks ago.  No nausea vomiting diarrhea constipation.  No urinary symptoms to include frequency dysuria urgency low back pain fever or chills.  Status post TVH BSO in the past for endometriosis.  She notes historically she had a lot of endometriosis in the right lower quadrant that required a general surgeon to help dissect with Dr. Cherylann Banas.  Past medical history,surgical history, problem list, medications, allergies, family history and social history were all reviewed and documented as reviewed in the EPIC chart.  ROS:  Performed with pertinent positives and negatives included in the history, assessment and plan.   Additional significant findings : None   Exam: [name] assistant Vitals:   07/19/17 1616  BP: 124/78  Weight: 176 lb (79.8 kg)  Height: 5\' 6"  (1.676 m)   Body mass index is 28.41 kg/m.  General appearance:  Normal affect, orientation and appearance. Skin: Grossly normal excepting 1 cm nodule left mid upper inner arm deeper in the biceps area.  No overlying skin changes or tenderness with palpation.  No axillary adenopathy HEENT: Without gross lesions.  No cervical or supraclavicular adenopathy. Thyroid normal.  Lungs:  Clear without wheezing, rales or rhonchi Cardiac: RR, without RMG Abdominal:  Soft, nontender, without masses, guarding, rebound, organomegaly or hernia Breasts:  Examined lying and sitting without masses, retractions, discharge or axillary adenopathy. Pelvic:  Ext, BUS, Vagina: With atrophic changes Pap smear of vaginal cuff done  Adnexa: Without masses or tenderness    Anus and perineum: Normal     Rectovaginal: Normal sphincter tone without palpated masses or tenderness.    Assessment/Plan:  61 y.o. G50P0020 female for annual gynecologic exam.  Status post LAVH BSO in the past  1. Right lower quadrant pain.  3-week duration.  No other associated symptoms.  Exam is negative.  Differential reviewed.  Most likely secondary to GI.  Possibly related to adhesions.  Will monitor for now.  If continues patient will call and will refer to GI for evaluation.  Colonoscopy 2016. 2. Small left upper inner arm nodule.  Reviewed differential with patient.  A little deeper sebaceous cyst but probably more consistent with small lipoma or fibrous tumor.  It is not bothersome to the patient.  Recommended observation for now.  If enlarges or becomes symptomatic then she will call for referral for excision.  If it remains stable and not bothersome to the patient she will monitor. 3. Postmenopausal/atrophic genital changes/HRT.  Continues on Climara 0.05 mg patch doing well.  Wants to continue.  I again reviewed the whole issue of HRT to include risks such as stroke heart attack DVT and the breast cancer issue.  Benefits as far as symptom relief and bone health/cardiovascular health when started early as in her case also reviewed.  Refill times 1 year provided. 4. History of hypothyroidism on thyroid replacement.  Recent TSH normal.  Refill of thyroid R times 1 year provided. 5. Mammography 06/2017.  Continue with annual mammography when due.  Breast exam normal today. 6. Pap smear 2015.  Pap smear of vaginal cuff today at patient request.  Reviewed current screening guidelines and  options to stop screening based on hysterectomy history discussed. 7. Colonoscopy 2016.  Repeat at their recommended interval. 8. DEXA 2012 normal.  Offered DEXA now but patient declined.  Prefers to wait until age 39. 40. Health maintenance.  Reviewed recent lab work with her.  Borderline cholesterol and LDL.  HDL 74.  Minimally elevated  ALT.  Normal AST.  Vitamin D level 27.  Patient supplementing vitamin D now will have vitamin D level rechecked this coming year.  Follow-up in 1 year, sooner as needed.   Anastasio Auerbach MD, 4:55 PM 07/19/2017

## 2017-07-19 NOTE — Patient Instructions (Signed)
Follow-up in 1 year for annual exam, sooner as needed. 

## 2017-07-20 NOTE — Addendum Note (Signed)
Addended by: Nelva Nay on: 07/20/2017 08:28 AM   Modules accepted: Orders

## 2017-07-21 LAB — URINALYSIS W MICROSCOPIC + REFLEX CULTURE
Bacteria, UA: NONE SEEN /HPF
Bilirubin Urine: NEGATIVE
Glucose, UA: NEGATIVE
HYALINE CAST: NONE SEEN /LPF
Hgb urine dipstick: NEGATIVE
Ketones, ur: NEGATIVE
Leukocyte Esterase: NEGATIVE
Nitrites, Initial: NEGATIVE
PROTEIN: NEGATIVE
RBC / HPF: NONE SEEN /HPF (ref 0–2)
SQUAMOUS EPITHELIAL / LPF: NONE SEEN /HPF (ref <=5)
Specific Gravity, Urine: 1.02 (ref 1.001–1.03)
WBC, UA: NONE SEEN /HPF (ref 0–5)
pH: 6.5 (ref 5.0–8.0)

## 2017-07-21 LAB — NO CULTURE INDICATED

## 2017-07-25 LAB — PAP IG W/ RFLX HPV ASCU

## 2017-08-15 ENCOUNTER — Other Ambulatory Visit: Payer: Self-pay | Admitting: Gynecology

## 2017-10-16 ENCOUNTER — Encounter (HOSPITAL_COMMUNITY): Payer: Self-pay

## 2017-10-16 ENCOUNTER — Emergency Department (HOSPITAL_COMMUNITY): Payer: BLUE CROSS/BLUE SHIELD

## 2017-10-16 ENCOUNTER — Other Ambulatory Visit: Payer: Self-pay

## 2017-10-16 ENCOUNTER — Emergency Department (HOSPITAL_COMMUNITY)
Admission: EM | Admit: 2017-10-16 | Discharge: 2017-10-16 | Disposition: A | Discharge status: Home / Self Care | Payer: BLUE CROSS/BLUE SHIELD | Attending: Emergency Medicine | Admitting: Emergency Medicine

## 2017-10-16 DIAGNOSIS — E039 Hypothyroidism, unspecified: Secondary | ICD-10-CM | POA: Insufficient documentation

## 2017-10-16 DIAGNOSIS — R110 Nausea: Secondary | ICD-10-CM | POA: Diagnosis not present

## 2017-10-16 DIAGNOSIS — Z96659 Presence of unspecified artificial knee joint: Secondary | ICD-10-CM | POA: Diagnosis not present

## 2017-10-16 DIAGNOSIS — R42 Dizziness and giddiness: Secondary | ICD-10-CM | POA: Diagnosis not present

## 2017-10-16 DIAGNOSIS — Z85828 Personal history of other malignant neoplasm of skin: Secondary | ICD-10-CM | POA: Insufficient documentation

## 2017-10-16 DIAGNOSIS — Z79899 Other long term (current) drug therapy: Secondary | ICD-10-CM | POA: Insufficient documentation

## 2017-10-16 DIAGNOSIS — M6281 Muscle weakness (generalized): Secondary | ICD-10-CM | POA: Insufficient documentation

## 2017-10-16 DIAGNOSIS — Z87891 Personal history of nicotine dependence: Secondary | ICD-10-CM | POA: Insufficient documentation

## 2017-10-16 LAB — BASIC METABOLIC PANEL
Anion gap: 10 (ref 5–15)
BUN: 13 mg/dL (ref 6–20)
CHLORIDE: 102 mmol/L (ref 101–111)
CO2: 25 mmol/L (ref 22–32)
CREATININE: 0.73 mg/dL (ref 0.44–1.00)
Calcium: 9.3 mg/dL (ref 8.9–10.3)
GFR calc non Af Amer: >60 mL/min (ref >60)
Glucose, Bld: 110 mg/dL — ABNORMAL HIGH (ref 65–99)
POTASSIUM: 4.2 mmol/L (ref 3.5–5.1)
SODIUM: 137 mmol/L (ref 135–145)

## 2017-10-16 LAB — CBC
HEMATOCRIT: 43.2 % (ref 36.0–46.0)
HEMOGLOBIN: 14.5 g/dL (ref 12.0–15.0)
MCH: 31.4 pg (ref 26.0–34.0)
MCHC: 33.6 g/dL (ref 30.0–36.0)
MCV: 93.5 fL (ref 78.0–100.0)
PLATELETS: 265 10*3/uL (ref 150–400)
RBC: 4.62 MIL/uL (ref 3.87–5.11)
RDW: 12.9 % (ref 11.5–15.5)
WBC: 5.5 10*3/uL (ref 4.0–10.5)

## 2017-10-16 LAB — I-STAT TROPONIN, ED: Troponin i, poc: 0 ng/mL (ref 0.00–0.08)

## 2017-10-16 NOTE — ED Triage Notes (Signed)
[facility]- pt was in her car began having nausea and dizziness, sudden onset with diaphoresis. Pt's only complaint at this time is nausea. 4mg  of zofran given PTA. 20g left hand. Hx of vertigo.

## 2017-10-16 NOTE — ED Provider Notes (Signed)
Patient placed in Quick Look pathway, seen and evaluated   Chief Complaint: Dizziness   HPI:   62 year old female presents today with complaints of increased thirst, dizziness and fatigue.  Patient notes that she has intermittent vertigo likely related to sinus congestion, last episode approximately 1.5 weeks ago.  She notes that she was sitting in a car earlier today had acute onset of sweats, fatigue, room spinning dizziness.  She notes symptoms have resolved, continues to be thirsty with generalized fatigue.  She notes numbness in her right hand this is baseline secondary to radiculopathy in the cervical region.  She denies any acute neurological deficits or significant chest pain throughout the event.  Patient notes that she does have intermittent left-sided chest pain, none presently or today.     ROS: Dizziness (one)  Physical Exam:   Gen: No distress  Neuro: Awake and Alert  Skin: Warm    Focused Exam: Cranial nerves intact   Initiation of care has begun. The patient has been counseled on the process, plan, and necessity for staying for the completion/evaluation, and the remainder of the medical screening examination.     Okey Regal, PA-C 10/16/17 [place], MD 10/16/17 2119

## 2017-10-16 NOTE — ED Provider Notes (Signed)
[facility] EMERGENCY DEPARTMENT Provider Note   CSN: [id number] Arrival date & time: 10/16/17  1350     History   Chief Complaint Chief Complaint  Patient presents with  . Nausea  . Dizziness    HPI Elaine Gray is a 62 y.o. female.  The patient has had sinus infection symptoms and taking a Sudafed.  Shortly afterwards she felt flushed, lightheaded, and nauseated.    Dizziness  Quality:  Lightheadedness Severity:  Moderate Onset quality:  Sudden Timing:  Constant Progression:  Resolved Chronicity:  New Context comment:  While driving - had taken sudafed Relieved by: zofran. Worsened by:  Nothing Associated symptoms: weakness (generalized, resolved)   Associated symptoms: no blood in stool, no chest pain, no diarrhea, no headaches, no nausea, no palpitations, no shortness of breath and no vomiting     Past Medical History:  Diagnosis Date  . ADD (attention deficit disorder)   . Allergy    mild respiratory allergies  . Arthritis    in neck, thumbs, knee and eyes   . Basal cell carcinoma 2005,2006   Chest-Leg  . Chronic constipation   . Colon polyps    tubular adenomatous  . Diverticulosis   . Endometriosis   . Glaucoma   . Seasonal allergies   . Thyroid disease    hypothyroid    Patient Active Problem List   Diagnosis Date Noted  . Glaucoma   . Reflux   . ENDOMETRIOSIS, SITE UNSPECIFIED 02/01/2008  . ABDOMINAL PAIN-RLQ 02/01/2008  . COLONIC POLYPS, ADENOMATOUS 01/31/2008  . HYPOTHYROIDISM 01/31/2008  . ANXIETY 01/31/2008    Past Surgical History:  Procedure Laterality Date  . BASAL CELL CARCINOMA EXCISION  2005,2006   Chest-Leg  . KNEE SURGERY     Arthroscopic  . OOPHORECTOMY    . PELVIC LAPAROSCOPY     Endometriosis  . Plantarfaciotomy  2006  . REPLACEMENT TOTAL KNEE    . VAGINAL HYSTERECTOMY  1996   LAVH BSO    OB History    Gravida Para Term Preterm AB Living   2       2 0   SAB TAB Ectopic Multiple  Live Births                   Home Medications    Prior to Admission medications   Medication Sig Start Date End Date Taking? Authorizing Provider  Alpha-D-Galactosidase (ECK FOOD ENZYME PO) Take 1 capsule by mouth every evening. Food Enzyme Compound    [provider]  AMINO ACIDS PO Take 1 capsule by mouth daily.     [provider]  amphetamine-dextroamphetamine (ADDERALL XR) 30 MG 24 hr capsule Take 30 mg by mouth daily. 09/07/16   [provider]  B Complex Vitamins (VITAMIN B COMPLEX PO) Take 1 capsule by mouth daily.     [provider]  Boswellia Serrata (BOSWELLIA PO) Take 1 capsule by mouth daily as needed (for arthritis pain).    [provider]  Carbonyl Iron (PERFECT IRON) 25 MG TABS Take 25 mg by mouth daily.    [provider]  CASCARA SAGRADA PO Take 1 capsule by mouth every evening.     [provider]  Cholecalciferol (VITAMIN D) 2000 units CAPS Take 2,000 Units by mouth See admin instructions. (2-3x's weekly)    [provider]  Coenzyme Q10 (COQ10) 100 MG CAPS Take 100 mg by mouth daily.     [provider]  [facility]  0.2-0.5 % ophthalmic solution Place 1 drop into the left eye 2 (two) times daily. 09/08/16   [provider]  CREATINE PO Take 250 mg by mouth See admin instructions. (2-3x's weekly)    [provider]  Cyanocobalamin (VITAMIN B-12 CR) 1000 MCG TBCR Take 1,000 mcg by mouth daily.    [provider]  estradiol (CLIMARA - DOSED IN MG/24 HR) 0.05 mg/24hr patch Place 1 patch (0.05 mg total) onto the skin once a week. 07/19/17   Fontaine, Belinda Block, MD  FOLIC ACID PO Take 381 mcg by mouth See admin instructions. (2-3x's weekly)    [provider]  latanoprost (XALATAN) 0.005 % ophthalmic solution Place 1 drop into both eyes at bedtime.     [provider]  magnesium oxide (MAG-OX) 400 MG tablet Take 400 mg by mouth every evening.     [provider]  Misc Natural Products (ADRENAL PO) Take 1 capsule by mouth See admin instructions. CHINESE FORMULA ADRENAL SUPPORT (2-3x's weekly)    [provider]  Nutritional Supplements (DHEA PO) Take 100 mg by mouth daily.    [provider]  Omega 3-6-9 Fatty Acids (OMEGA-3-6-9 PO) Take 1 capsule by mouth See admin instructions. (3-4x's weekly)    [provider]  OVER THE COUNTER MEDICATION Take 1 capsule by mouth daily. Master Gland    [provider]  Probiotic Product (PROBIOTIC DAILY PO) Take 1 capsule by mouth every evening. Probiotic 50 billion Compound    [provider]  Quercetin 250 MG TABS Take 250 mg by mouth See admin instructions. (2-3x's weekly)    [provider]  sodium chloride (OCEAN) 0.65 % SOLN nasal spray Place 1 spray into both nostrils at bedtime.    [provider]  thyroid (ARMOUR THYROID) 120 MG tablet TAKE 1 TABLET BY MOUTH DAILY BEFORE BREAKFAST 08/16/17   Fontaine, Belinda Block, MD    Family History Family History  Problem Relation Age of Onset  . Diabetes Mother   . Hypertension Mother   . Heart disease Mother   . Uterine cancer Mother   . Dementia Mother   . Thyroid disease Mother   . Colon polyps Mother   . Hypertension Father   . Heart disease Father   . Diabetes Maternal Grandfather   . Hypertension Maternal Grandfather   . Heart disease Paternal Grandfather   . Hypertension Paternal Grandfather   . Glaucoma Maternal Grandmother   . Hypertension Maternal Grandmother   . Hypertension Sister   . Thyroid disease Sister   . Cancer Maternal Uncle        Pancreatic  . Hypertension Paternal Grandmother   . Heart disease Paternal Grandmother   . Colon cancer Neg Hx     Social History Social History   Tobacco Use  . Smoking status: Former Research scientist (life sciences)  . Smokeless tobacco: Never Used  Substance Use Topics  . Alcohol use: Yes    Alcohol/week: 0.0 oz    Comment: social-Rare    . Drug use: No     Allergies   Codeine; Amoxicillin-pot clavulanate; Other; and Sulfonamide derivatives   Review of Systems Review of Systems  Constitutional: Negative for chills and fever.  HENT: Negative for ear pain and sore throat.   Eyes: Negative for pain and visual disturbance.  Respiratory: Negative for cough and shortness of breath.   Cardiovascular: Negative for chest pain and palpitations.  Gastrointestinal: Negative for abdominal pain, blood in stool, diarrhea, nausea and vomiting.  Genitourinary: Negative for dysuria and hematuria.  Musculoskeletal: Negative for arthralgias and back pain.  Skin: Positive for color change (facial flushing). Negative for rash.  Neurological: Positive for dizziness and weakness (generalized, resolved). Negative for seizures, syncope and headaches.  All other systems reviewed and are negative.    Physical Exam Updated Vital Signs BP 120/63   Pulse 90   Temp 97.6 F (36.4 C) (Oral)   Resp 16   Ht 5\' 6"  (1.676 m)   Wt 79.4 kg (175 lb)   SpO2 100%   BMI 28.25 kg/m   Physical Exam  Constitutional: She is oriented to person, place, and time. She appears well-developed and well-nourished. No distress.  HENT:  Head: Normocephalic and atraumatic.  Eyes: Conjunctivae and EOM are normal. Pupils are equal, round, and reactive to light.  Neck: Normal range of motion. Neck supple.  Cardiovascular: Normal rate and regular rhythm.  Pulmonary/Chest: Effort normal and breath sounds normal. No respiratory distress.  Abdominal: Soft. There is no tenderness.  Musculoskeletal: She exhibits no edema.  Neurological: She is alert and oriented to person, place, and time.  Skin: Skin is warm and dry.  Psychiatric: She has a normal mood and affect.  Nursing note and vitals reviewed.   Mental Status:  Orientation: Alert and oriented to person, place, and time.  Memory: Cooperative, follows commands well. Recent and remote memory normal.   Attention, Concentration: Attention span and concentration are normal.  Language: Speech is clear and language is normal.  Fund of Knowledge: Aware of current events, vocabulary appropriate for patient age.   Cranial Nerves       II, III, IV, VI:  Pupils equal and reactive to light and near. Full eye movement without nystagmus   V Facial Sensation:  Normal. No weakness of masticatory muscles   VII:  No facial weakness or asymmetry   VIII Auditory Acuity:  Grossly normal   IX/X:  The uvula is midline; the palate elevates symmetrically   XI:  Normal sternocleidomastoid and trapezius strength   XII:  The tongue is midline. No atrophy or fasciculations.      Motor System:  Muscle Strength: 5/5 and symmetric in the upper and lower extremities. No pronation or drift.  Muscle Tone: Tone and muscle bulk are normal in the upper and lower extremities.   Reflexes:  DTRs: 2+ and symmetrical in all four extremities.  Coordination:  Intact finger-to-nose. No tremor.   Sensation:  Intact to light touch.   Gait:  Routine gait is normal   Other:         ED Treatments / Results  Labs (all labs ordered are listed, but only abnormal results are displayed) Labs Reviewed  BASIC METABOLIC PANEL - Abnormal; Notable for the following components:      Result Value   Glucose, Bld 110 (*)    All other components within normal limits  CBC  URINALYSIS, ROUTINE W REFLEX MICROSCOPIC  I-STAT TROPONIN, ED  CBG MONITORING, ED    EKG  EKG Interpretation  Date/Time:  Monday October 16 2017 14:07:32 EST Ventricular Rate:  78 PR Interval:  160 QRS Duration: 76 QT Interval:  416 QTC Calculation: 474 R Axis:   72 Text Interpretation:  Normal sinus rhythm Normal ECG Confirmed by Lajean Saver [phone]) on 10/16/2017 7:28:13 PM       Radiology Dg Chest 2 View  Result Date: 10/16/2017 CLINICAL DATA:  Intermittent chest pain.  Former smoker. EXAM: CHEST  2 VIEW COMPARISON:  Frontal  chest x-ray of  June 12, 2015 FINDINGS: The lungs are well-expanded and clear. The heart and pulmonary vascularity are normal. The mediastinum is normal in width. There is no pleural effusion. The bony thorax exhibits no acute abnormality. There is moderate levocurvature centered in the upper lumbar spine. IMPRESSION: Chronic bronchitic changes, stable. No acute cardiopulmonary abnormality. Electronically Signed   By: David  Martinique M.D.   On: 10/16/2017 15:18    Procedures Procedures (including critical care time)  Medications Ordered in ED Medications - No data to display   Initial Impression / Assessment and Plan / ED Course  I have reviewed the triage vital signs and the nursing notes.  Pertinent labs & imaging results that were available during my care of the patient were reviewed by me and considered in my medical decision making (see chart for details).     Elaine Gray is a 62 year old female with past medical history significant for ADD, hypothyroidism, anxiety who presents for nausea and lightheadedness.  EKG obtained and demonstrates normal sinus rhythm.  Chest x-ray obtained, personally reviewed by me, demonstrates chronic bronchitis changes with no acute cardiopulmonary process.  Zofran given for nausea.  Labs significant for normal glucose, negative troponin, no acute electrolyte imbalance.  Patient endorses taking Sudafed prior to the episode.  Symptoms resolved while she was in the waiting room.  Likely the patient experienced a side effect of the medication.  Patient is discharged home with strict return precautions, follow-up instructions, and educational materials.  Final Clinical Impressions(s) / ED Diagnoses   Final diagnoses:  Dizziness  Nausea    ED Discharge Orders    None       Elveria Rising, MD 10/16/17 2346    Lajean Saver, MD 10/19/17 1537

## 2017-12-26 ENCOUNTER — Other Ambulatory Visit: Payer: BLUE CROSS/BLUE SHIELD

## 2017-12-26 DIAGNOSIS — E559 Vitamin D deficiency, unspecified: Secondary | ICD-10-CM

## 2017-12-27 LAB — VITAMIN D 25 HYDROXY (VIT D DEFICIENCY, FRACTURES): Vit D, 25-Hydroxy: 36 ng/mL (ref 30–100)

## 2018-05-16 ENCOUNTER — Telehealth: Payer: Self-pay | Admitting: *Deleted

## 2018-05-16 DIAGNOSIS — Z1329 Encounter for screening for other suspected endocrine disorder: Secondary | ICD-10-CM

## 2018-05-16 DIAGNOSIS — Z13220 Encounter for screening for lipoid disorders: Secondary | ICD-10-CM

## 2018-05-16 DIAGNOSIS — Z1321 Encounter for screening for nutritional disorder: Secondary | ICD-10-CM

## 2018-05-16 DIAGNOSIS — Z01419 Encounter for gynecological examination (general) (routine) without abnormal findings: Secondary | ICD-10-CM

## 2018-05-16 NOTE — Telephone Encounter (Signed)
Patient has annual scheduled on 07/20/18, has lab appointment scheduled on 07/13/18, requesting labs prior. Please advise

## 2018-05-16 NOTE — Telephone Encounter (Signed)
Left message on voicemail orders placed.

## 2018-05-16 NOTE — Telephone Encounter (Signed)
Okay for CBC, comprehensive metabolic panel, lipid profile, TSH and vitamin D.  Diagnoses are GYN exam, hypothyroidism, vitamin D deficiency

## 2018-06-11 ENCOUNTER — Encounter: Payer: Self-pay | Admitting: Gynecology

## 2018-06-14 ENCOUNTER — Encounter: Payer: Self-pay | Admitting: Neurology

## 2018-06-15 ENCOUNTER — Other Ambulatory Visit: Payer: Self-pay | Admitting: *Deleted

## 2018-06-15 DIAGNOSIS — G5601 Carpal tunnel syndrome, right upper limb: Secondary | ICD-10-CM

## 2018-07-10 ENCOUNTER — Ambulatory Visit (INDEPENDENT_AMBULATORY_CARE_PROVIDER_SITE_OTHER): Payer: BLUE CROSS/BLUE SHIELD | Admitting: Neurology

## 2018-07-10 DIAGNOSIS — G5601 Carpal tunnel syndrome, right upper limb: Secondary | ICD-10-CM | POA: Diagnosis not present

## 2018-07-10 NOTE — Procedures (Addendum)
[facility]  [address]  [place], [place] [address] Tel: [phone] Fax:  [phone] Test Date:  07/10/2018  Patient: Elaine Gray DOB: 08/26/1956 Physician: Narda Amber, DO  Sex: Female Height: 5\' 6"  Ref Phys: Elsie Saas, MD  ID#: [id number] Temp: 34.0C Technician:    Patient Complaints: This is a 62 year old female referred for evaluation of right wrist pain and hand numbness.  NCV & EMG Findings: Extensive electrodiagnostic testing of the right upper extremity shows:  1. Right median sensory response shows prolonged distal peak latency (6.6 ms) and reduced amplitude (4.1 V).  Right ulnar sensory responses within normal limits. 2. Right median motor response shows prolonged distal onset latency (6.0 ms); of note, there is evidence of a median-to-ulnar crossover in the forearm, as evidenced by a motor response when stimulating at the ulnar-wrist, consistent with a Martin-Gruber anastomosis.  The right ulnar motor responses within normal limits. 3. There is no evidence of active or chronic motor axonal loss changes affecting any of the tested muscles.  Motor unit configuration and recruitment pattern is within normal limits.  Impression: 1. Right median neuropathy at or distal to the wrist, consistent with a clinical diagnosis of carpal tunnel syndrome.  Overall, these findings are moderate-to-severe in degree electrically. 2. Incidentally, there is a right Martin-Gruber anastomosis, a normal variant.   ___________________________ Narda Amber, DO    Nerve Conduction Studies Anti Sensory Summary Table   Site NR Peak (ms) Norm Peak (ms) P-T Amp (V) Norm P-T Amp  Right Median Anti Sensory (2nd Digit)  34C  Wrist    6.6 <3.8 4.1 >10  Right Ulnar Anti Sensory (5th Digit)  34C  Wrist    2.4 <3.2 34.5 >5   Motor Summary Table   Site NR Onset (ms) Norm Onset (ms) O-P Amp (mV) Norm O-P Amp Site1 Site2 Delta-0 (ms) Dist (cm) Vel (m/s) Norm  Vel (m/s)  Right Median Motor (Abd Poll Brev)  34C  Wrist    6.0 <4.0 6.7 >5 Elbow Wrist 3.0 27.0 90 >50  Elbow    9.0  6.1  Ulnar-wrist crossover Elbow 5.1 0.0    Ulnar-wrist crossover    3.9  1.6         Right Ulnar Motor (Abd Dig Minimi)  34C  Wrist    0.9 <3.1 8.0 >7 B Elbow Wrist 4.3 24.0 56 >50  B Elbow    5.2  8.0  A Elbow B Elbow 1.9 10.0 53 >50  A Elbow    7.1  7.6          EMG   Side Muscle Ins Act Fibs Psw Fasc Number Recrt Dur Dur. Amp Amp. Poly Poly. Comment  Right 1stDorInt Nml Nml Nml Nml Nml Nml Nml Nml Nml Nml Nml Nml N/A  Right Abd Poll Brev Nml Nml Nml Nml Nml Nml Nml Nml Nml Nml Nml Nml N/A  Right PronatorTeres Nml Nml Nml Nml Nml Nml Nml Nml Nml Nml Nml Nml N/A  Right Biceps Nml Nml Nml Nml Nml Nml Nml Nml Nml Nml Nml Nml N/A  Right Triceps Nml Nml Nml Nml Nml Nml Nml Nml Nml Nml Nml Nml N/A  Right Deltoid Nml Nml Nml Nml Nml Nml Nml Nml Nml Nml Nml Nml N/A  Right Infraspinatus Nml Nml Nml Nml Nml Nml Nml Nml Nml Nml Nml Nml N/A      Waveforms:

## 2018-07-13 ENCOUNTER — Other Ambulatory Visit: Payer: BLUE CROSS/BLUE SHIELD

## 2018-07-13 DIAGNOSIS — Z1329 Encounter for screening for other suspected endocrine disorder: Secondary | ICD-10-CM

## 2018-07-13 DIAGNOSIS — Z1321 Encounter for screening for nutritional disorder: Secondary | ICD-10-CM

## 2018-07-13 DIAGNOSIS — Z13220 Encounter for screening for lipoid disorders: Secondary | ICD-10-CM

## 2018-07-13 DIAGNOSIS — Z01419 Encounter for gynecological examination (general) (routine) without abnormal findings: Secondary | ICD-10-CM

## 2018-07-14 LAB — CBC
HCT: 43.2 % (ref 35.0–45.0)
HEMOGLOBIN: 15 g/dL (ref 11.7–15.5)
MCH: 31.3 pg (ref 27.0–33.0)
MCHC: 34.7 g/dL (ref 32.0–36.0)
MCV: 90.2 fL (ref 80.0–100.0)
MPV: 11.9 fL (ref 7.5–12.5)
Platelets: 303 10*3/uL (ref 140–400)
RBC: 4.79 10*6/uL (ref 3.80–5.10)
RDW: 11.8 % (ref 11.0–15.0)
WBC: 6.2 10*3/uL (ref 3.8–10.8)

## 2018-07-14 LAB — COMPREHENSIVE METABOLIC PANEL
AG RATIO: 1.8 (calc) (ref 1.0–2.5)
ALBUMIN MSPROF: 4.5 g/dL (ref 3.6–5.1)
ALKALINE PHOSPHATASE (APISO): 93 U/L (ref 33–130)
ALT: 35 U/L — ABNORMAL HIGH (ref 6–29)
AST: 23 U/L (ref 10–35)
BILIRUBIN TOTAL: 0.8 mg/dL (ref 0.2–1.2)
BUN: 13 mg/dL (ref 7–25)
CO2: 27 mmol/L (ref 20–32)
Calcium: 9.6 mg/dL (ref 8.6–10.4)
Chloride: 107 mmol/L (ref 98–110)
Creat: 0.79 mg/dL (ref 0.50–0.99)
Globulin: 2.5 g/dL (calc) (ref 1.9–3.7)
Glucose, Bld: 96 mg/dL (ref 65–99)
POTASSIUM: 4.6 mmol/L (ref 3.5–5.3)
SODIUM: 141 mmol/L (ref 135–146)
Total Protein: 7 g/dL (ref 6.1–8.1)

## 2018-07-14 LAB — VITAMIN D 25 HYDROXY (VIT D DEFICIENCY, FRACTURES): VIT D 25 HYDROXY: 32 ng/mL (ref 30–100)

## 2018-07-14 LAB — TSH: TSH: 2.49 mIU/L (ref 0.40–4.50)

## 2018-07-14 LAB — LIPID PANEL
CHOLESTEROL: 232 mg/dL — AB (ref <200)
HDL: 73 mg/dL (ref >50)
LDL Cholesterol (Calc): 137 mg/dL (calc) — ABNORMAL HIGH
Non-HDL Cholesterol (Calc): 159 mg/dL (calc) — ABNORMAL HIGH (ref <130)
TRIGLYCERIDES: 116 mg/dL (ref <150)
Total CHOL/HDL Ratio: 3.2 (calc) (ref <5.0)

## 2018-07-16 ENCOUNTER — Encounter: Payer: Self-pay | Admitting: Gastroenterology

## 2018-07-16 ENCOUNTER — Encounter: Payer: Self-pay | Admitting: Gynecology

## 2018-07-20 ENCOUNTER — Encounter: Payer: Self-pay | Admitting: Gynecology

## 2018-07-20 ENCOUNTER — Ambulatory Visit (INDEPENDENT_AMBULATORY_CARE_PROVIDER_SITE_OTHER): Payer: BLUE CROSS/BLUE SHIELD | Admitting: Gynecology

## 2018-07-20 VITALS — BP 124/80 | Ht 65.5 in | Wt 181.0 lb

## 2018-07-20 DIAGNOSIS — N952 Postmenopausal atrophic vaginitis: Secondary | ICD-10-CM

## 2018-07-20 DIAGNOSIS — Z01419 Encounter for gynecological examination (general) (routine) without abnormal findings: Secondary | ICD-10-CM | POA: Diagnosis not present

## 2018-07-20 DIAGNOSIS — Z13220 Encounter for screening for lipoid disorders: Secondary | ICD-10-CM

## 2018-07-20 DIAGNOSIS — E559 Vitamin D deficiency, unspecified: Secondary | ICD-10-CM

## 2018-07-20 DIAGNOSIS — Z79890 Hormone replacement therapy (postmenopausal): Secondary | ICD-10-CM | POA: Diagnosis not present

## 2018-07-20 DIAGNOSIS — E038 Other specified hypothyroidism: Secondary | ICD-10-CM

## 2018-07-20 MED ORDER — THYROID 120 MG PO TABS: ORAL_TABLET | ORAL | 11 refills | Status: DC

## 2018-07-20 MED ORDER — ESTRADIOL 0.05 MG/24HR TD PTWK
0.0500 mg | MEDICATED_PATCH | TRANSDERMAL | 4 refills | Status: DC
Start: 2018-07-20 — End: 2019-07-22

## 2018-07-20 NOTE — Progress Notes (Signed)
    Elaine Gray Nov 19, 1955 [id number]        62 y.o.  G2P0020 for annual gynecologic exam.  Without gynecologic complaints.  Past medical history,surgical history, problem list, medications, allergies, family history and social history were all reviewed and documented as reviewed in the EPIC chart.  ROS:  Performed with pertinent positives and negatives included in the history, assessment and plan.   Additional significant findings : None   Exam: [name] assistant Vitals:   07/20/18 1113  BP: 124/80  Weight: 181 lb (82.1 kg)  Height: 5' 5.5" (1.664 m)   Body mass index is 29.66 kg/m.  General appearance:  Normal affect, orientation and appearance. Skin: Grossly normal HEENT: Without gross lesions.  No cervical or supraclavicular adenopathy. Thyroid normal.  Lungs:  Clear without wheezing, rales or rhonchi Cardiac: RR, without RMG Abdominal:  Soft, nontender, without masses, guarding, rebound, organomegaly or hernia Breasts:  Examined lying and sitting without masses, retractions, discharge or axillary adenopathy. Pelvic:  Ext, BUS, Vagina: With atrophic changes  Adnexa: Without masses or tenderness    Anus and perineum: Normal   Rectovaginal: Normal sphincter tone without palpated masses or tenderness.    Assessment/Plan:  62 y.o. G16P0020 female for annual gynecologic exam status post LAVH BSO in the past.   1. Postmenopausal/HRT.  Continues on Climara 0.05 mg patch.  Doing well.  We again discussed the risks versus benefits.  Thrombosis such as stroke heart attack DVT in the breast cancer issue reviewed versus symptom relief and possible cardiovascular and bone health.  At this point the patient is comfortable continuing and I refilled her x1 year. 2. History of hypothyroidism.  Recent lab work shows normal TSH.  Thyroid Armour refill x1 year. 3. Vitamin D deficiency.  Most recent vitamin D 32.  She is increasing her vitamin D supplement.  Recommend recheck value  in 6 months. 4. DEXA 2012 normal.  Patient wants to wait till next year at age 40 to repeat this.  We will plan on doing so at her preference. 5. Pap smear 2018.  No Pap smear done today.  No history of abnormal Pap smears.  Options to stop screening per current screening guidelines based on hysterectomy reviewed. 6. Colonoscopy 2016.  Repeat at their recommended interval. 7. Mammography 06/2017.  Schedule mammogram now.  Breast exam normal today. 8. Health maintenance.  Reviewed her recent lab work.  Mild elevated cholesterol and LDL.  Patient working on this with exercise.  Recommend repeat fasting lipid profile in 6 months.  Recommend patient to establish care with a primary physician this coming year she is turning 63 for ongoing monitoring and health care.Anastasio Auerbach MD, 12:45 PM 07/20/2018

## 2018-07-20 NOTE — Patient Instructions (Signed)
Follow-up in 1 year for annual exam.  Follow-up in 6 months to recheck your fasting lipid profile and vitamin D level.  Establish care with a primary physician for ongoing health care as you are turning 63.

## 2019-03-15 ENCOUNTER — Telehealth: Payer: Self-pay | Admitting: *Deleted

## 2019-03-15 NOTE — Telephone Encounter (Signed)
Okay to prescribe nature thyroid.  I would ask the pharmacist to pick an equivalent dose to her 120 mg Armour Thyroid.  They should be able to do this.

## 2019-03-15 NOTE — Telephone Encounter (Signed)
Patient said current insurance no longer covers armour thyroid 120mg  , but they will cover nature thyroid asked if Rx can be sent for this? Please advise

## 2019-03-18 NOTE — Telephone Encounter (Signed)
Late entry I called pharmacy and spoke with pharmacist and was informed patient did pick up Armour Thyroid last week. I called patient and left message asking her to call me to see if I understood her originally message correctly.

## 2019-03-19 NOTE — Telephone Encounter (Signed)
Patient called back stating she has new insurance and the Elaine Gray Thyroid will be cheaper, I tried to call [facility] [phone] several times to find out equivalent dose to her 120 mg Armour Thyroid, but the line is busy.

## 2019-03-20 MED ORDER — THYROID 130 MG PO TABS: 130.0000 mg | ORAL_TABLET | Freq: Every day | ORAL | 1 refills | Status: DC

## 2019-03-20 NOTE — Telephone Encounter (Signed)
I had to send a note via fax to find out the answer to the below, pharmacy sent fax back stating  the equivalent is Nature-throid 130 mg tablet. Rx sent. Left detailed message on cell Rx sent.

## 2019-04-04 MED ORDER — THYROID 130 MG PO TABS: 130.0000 mg | ORAL_TABLET | Freq: Every day | ORAL | 1 refills | Status: DC

## 2019-04-04 NOTE — Telephone Encounter (Signed)
Patient called back stating the pharmacy didn't have Rx for the below. I called and spoke with pharmacist and she saw the Rx , but asked if I could send it again. It apparently was sitting a que she couldn't fill it. Rx re-sent patient aware as well.

## 2019-04-04 NOTE — Addendum Note (Signed)
Addended by: Thamas Jaegers on: 04/04/2019 04:32 PM   Modules accepted: Orders

## 2019-04-29 ENCOUNTER — Telehealth: Payer: Self-pay | Admitting: *Deleted

## 2019-04-29 NOTE — Telephone Encounter (Signed)
Prior Authorization for climara 0.05mg  tablet done via cover my meds. Medication approved [id number] Status:Approved;Review Type:Prior Auth;Coverage Start Date:03/30/2019;Coverage End Date:04/28/2020;

## 2019-05-23 ENCOUNTER — Encounter: Payer: Self-pay | Admitting: Gynecology

## 2019-05-23 NOTE — Telephone Encounter (Signed)
I cannot promise what medications a specific provider would prescribe.  2 options: Go on [employer] and look for a provider in an office that is close to the patient.  You can look at pictures and educational backgrounds.  Second option would be Dr. Garret Reddish at [facility] [place] [address] [facility]

## 2019-06-26 ENCOUNTER — Encounter: Payer: Self-pay | Admitting: Gynecology

## 2019-07-21 ENCOUNTER — Other Ambulatory Visit: Payer: Self-pay | Admitting: Gynecology

## 2019-08-06 ENCOUNTER — Other Ambulatory Visit: Payer: Self-pay | Admitting: Gynecology

## 2019-08-07 ENCOUNTER — Other Ambulatory Visit: Payer: Self-pay | Admitting: Gynecology

## 2019-08-07 ENCOUNTER — Encounter: Payer: Self-pay | Admitting: Gynecology

## 2019-08-07 ENCOUNTER — Ambulatory Visit (INDEPENDENT_AMBULATORY_CARE_PROVIDER_SITE_OTHER): Payer: Medicare Other | Admitting: Gynecology

## 2019-08-07 ENCOUNTER — Other Ambulatory Visit: Payer: Self-pay

## 2019-08-07 VITALS — BP 118/78 | Ht 65.5 in | Wt 179.0 lb

## 2019-08-07 DIAGNOSIS — Z9189 Other specified personal risk factors, not elsewhere classified: Secondary | ICD-10-CM | POA: Diagnosis not present

## 2019-08-07 DIAGNOSIS — Z01419 Encounter for gynecological examination (general) (routine) without abnormal findings: Secondary | ICD-10-CM | POA: Diagnosis not present

## 2019-08-07 DIAGNOSIS — N952 Postmenopausal atrophic vaginitis: Secondary | ICD-10-CM

## 2019-08-07 DIAGNOSIS — E039 Hypothyroidism, unspecified: Secondary | ICD-10-CM

## 2019-08-07 DIAGNOSIS — Z79890 Hormone replacement therapy (postmenopausal): Secondary | ICD-10-CM

## 2019-08-07 MED ORDER — ESTRADIOL 0.05 MG/24HR TD PTWK: MEDICATED_PATCH | TRANSDERMAL | 4 refills | Status: DC

## 2019-08-07 MED ORDER — THYROID 120 MG PO TABS: 120.0000 mg | ORAL_TABLET | Freq: Every day | ORAL | 4 refills | Status: DC

## 2019-08-07 NOTE — Patient Instructions (Signed)
Follow-up for the bone density as scheduled.  Follow-up in 1 year for annual exam 

## 2019-08-07 NOTE — Progress Notes (Signed)
    Elaine Gray 1955/12/31 [id number]        63 y.o.  G2P0020 for breast and pelvic exam.  Without gynecologic complaints  Past medical history,surgical history, problem list, medications, allergies, family history and social history were all reviewed and documented as reviewed in the EPIC chart.  ROS:  Performed with pertinent positives and negatives included in the history, assessment and plan.   Additional significant findings : None   Exam: [name] assistant Vitals:   08/07/19 1159  BP: 118/78  Weight: 179 lb (81.2 kg)  Height: 5' 5.5" (1.664 m)   Body mass index is 29.33 kg/m.  General appearance:  Normal affect, orientation and appearance. Skin: Grossly normal HEENT: Without gross lesions.  No cervical or supraclavicular adenopathy. Thyroid normal.  Lungs:  Clear without wheezing, rales or rhonchi Cardiac: RR, without RMG Abdominal:  Soft, nontender, without masses, guarding, rebound, organomegaly or hernia Breasts:  Examined lying and sitting without masses, retractions, discharge or axillary adenopathy. Pelvic:  Ext, BUS, Vagina: With atrophic changes  Adnexa: Without masses or tenderness    Anus and perineum: Normal   Rectovaginal: Normal sphincter tone without palpated masses or tenderness.    Assessment/Plan:  63 y.o. G42P0020 female for breast and pelvic exam status post LAVH BSO in the past  1. Postmenopausal/HRT.  On Climara 0.05 mg patch.  Doing well and wants to continue.  We again discussed the risks to include thrombosis such as stroke heart attack DVT in the breast cancer issue.  At this point the patient would prefer to continue.  Refill x1 year provided. 2. History of hypothyroidism.  On Armour Thyroid.  Check TSH today.  Refill Armour Thyroid x1 year. 3. DEXA 2012 normal.  Plan follow-up DEXA now at age 13 and she will schedule in follow-up for this. 4. Pap smear 2018.  No Pap smear done today.  No history of significant abnormal Pap smears.   Options to stop screening per current screening guidelines reviewed.  Will readdress on annual basis. 5. Mammography overdue and patient is going to schedule.  Breast exam normal today. 6. Colonoscopy 2016.  Repeat at their recommended interval. 7. Health maintenance.  No routine lab work done as patient is in the process of establishing a primary provider and will follow-up with them for this.  Follow-up 1 year, sooner as needed.   Anastasio Auerbach MD, 12:34 PM 08/07/2019

## 2019-08-08 ENCOUNTER — Encounter: Payer: Self-pay | Admitting: Gynecology

## 2019-08-08 LAB — TSH: TSH: 1.15 mIU/L (ref 0.40–4.50)

## 2019-11-02 ENCOUNTER — Ambulatory Visit: Payer: Medicare Other | Attending: Internal Medicine

## 2019-11-02 DIAGNOSIS — Z23 Encounter for immunization: Secondary | ICD-10-CM | POA: Insufficient documentation

## 2019-11-02 NOTE — Progress Notes (Signed)
   Covid-19 Vaccination Clinic  Name:  Elaine Gray    MRN: [id number] DOB: [birth year]  11/02/2019  Elaine Gray was observed post Covid-19 immunization for 15 minutes without incidence. She was provided with Vaccine Information Sheet and instruction to access the V-Safe system.   Elaine Gray was instructed to call 911 with any severe reactions post vaccine: Marland Kitchen Difficulty breathing  . Swelling of your face and throat  . A fast heartbeat  . A bad rash all over your body  . Dizziness and weakness    Immunizations Administered    Name Date Dose VIS Date Route   Pfizer COVID-19 Vaccine 11/02/2019  2:30 PM 0.3 mL 08/30/2019 Intramuscular   Manufacturer: [place]   Lot: [id number]   [place]: [id number]

## 2019-11-25 ENCOUNTER — Ambulatory Visit: Payer: Medicare Other | Attending: Internal Medicine

## 2019-11-25 DIAGNOSIS — Z23 Encounter for immunization: Secondary | ICD-10-CM | POA: Insufficient documentation

## 2019-11-25 NOTE — Progress Notes (Signed)
   Covid-19 Vaccination Clinic  Name:  Elaine Gray    MRN: [id number] DOB: 1956-03-06  11/25/2019  [name] was observed post Covid-19 immunization for 30 minutes based on pre-vaccination screening without incident. She was provided with Vaccine Information Sheet and instruction to access the V-Safe system.   [name] was instructed to call 911 with any severe reactions post vaccine: Marland Kitchen Difficulty breathing  . Swelling of face and throat  . A fast heartbeat  . A bad rash all over body  . Dizziness and weakness   Immunizations Administered    Name Date Dose VIS Date Route   Pfizer COVID-19 Vaccine 11/25/2019  2:24 PM 0.3 mL 08/30/2019 Intramuscular   Manufacturer: [place]   Lot: [id number]   [place]: [id number]

## 2019-12-09 ENCOUNTER — Telehealth: Payer: Self-pay

## 2019-12-09 NOTE — Telephone Encounter (Signed)
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  Note from pharmacy regarding Armour Thyroid 120 mg tablet.  "This drug is not covered on our formulary. We will not continue to pay for this drug after you have received the maximum 30 days temporary supply that we are required to cover, unless you obtain a formulary exception from [place]. In order for you patient to received coverage under plan, please be sure to follow one of two options below:  1. Choose an alternative medication that is on your patient's formulary , and write a new prescription for that alternative drug.  [email or web address]  2. Request a coverage review if special circumstances require that you patient continue on this medication.  Please advise.

## 2019-12-11 NOTE — Telephone Encounter (Signed)
Would she be open to going on levothyroxine instead?

## 2019-12-11 NOTE — Telephone Encounter (Signed)
Left message to call me.

## 2019-12-11 NOTE — Telephone Encounter (Signed)
Patient called back. She said she is on the Armour Gray because the Elaine Gray she was taking is on backorder. She said she prefers to be on this more natural medication. She wants you to see if you can get it approved for her to stay on it and if not she said she will pay out of pocket for it because she feels well on it.

## 2020-01-01 ENCOUNTER — Telehealth: Payer: Self-pay | Admitting: *Deleted

## 2020-01-01 NOTE — Telephone Encounter (Signed)
PA done via cover my meds for Armour thyroid 120 mcg tablet medication approved from 12/02/19-01/01/20. Patient informed.

## 2020-08-06 ENCOUNTER — Other Ambulatory Visit: Payer: Self-pay

## 2020-08-06 MED ORDER — ESTRADIOL 0.05 MG/24HR TD PTWK: MEDICATED_PATCH | TRANSDERMAL | 0 refills | Status: DC

## 2020-08-06 NOTE — Telephone Encounter (Signed)
Annual exam scheduled with Dr. Delilah Shan for 09/03/20.

## 2020-09-03 ENCOUNTER — Encounter: Payer: Medicare Other | Admitting: Obstetrics and Gynecology

## 2020-10-29 ENCOUNTER — Other Ambulatory Visit: Payer: Self-pay | Admitting: Obstetrics and Gynecology

## 2020-10-29 NOTE — Telephone Encounter (Signed)
Medication refill request: [facility] Last AEX:  08-07-2019 TF Next AEX: 11-16-20 TW Last MMG (if hormonal medication request): 07-20-20 density c/birads 1 negative Refill authorized: Today, please advise.   Medication pended for #4, 0RF. Please refill if appropriate.

## 2020-11-16 ENCOUNTER — Encounter: Payer: Self-pay | Admitting: Nurse Practitioner

## 2020-11-16 ENCOUNTER — Ambulatory Visit (INDEPENDENT_AMBULATORY_CARE_PROVIDER_SITE_OTHER): Payer: Medicare Other | Admitting: Nurse Practitioner

## 2020-11-16 ENCOUNTER — Other Ambulatory Visit: Payer: Self-pay

## 2020-11-16 VITALS — BP 130/80 | Ht 65.0 in | Wt 186.0 lb

## 2020-11-16 DIAGNOSIS — Z780 Asymptomatic menopausal state: Secondary | ICD-10-CM

## 2020-11-16 DIAGNOSIS — Z9189 Other specified personal risk factors, not elsewhere classified: Secondary | ICD-10-CM

## 2020-11-16 DIAGNOSIS — Z01419 Encounter for gynecological examination (general) (routine) without abnormal findings: Secondary | ICD-10-CM | POA: Diagnosis not present

## 2020-11-16 DIAGNOSIS — Z90710 Acquired absence of both cervix and uterus: Secondary | ICD-10-CM

## 2020-11-16 DIAGNOSIS — Z79890 Hormone replacement therapy (postmenopausal): Secondary | ICD-10-CM

## 2020-11-16 MED ORDER — ESTRADIOL 0.05 MG/24HR TD PTWK: 0.0500 mg | MEDICATED_PATCH | TRANSDERMAL | 3 refills | Status: DC

## 2020-11-16 NOTE — Progress Notes (Signed)
   Elaine Gray [birth year] [id number]   History:  65 y.o. G2P0020 presents for breast and pelvic exam. 1996 LAVH BSO on ERT. She complains of night sweats and change in body odor. She says the night sweats are not like menopausal night sweats and she feels it is her thyroid. History of hypothyroidism being managed by PCP who just did lab work last week. She is thinking about seeing endocrinology. Normal pap and mammogram history.   Gynecologic History No LMP recorded. Patient has had a hysterectomy.   Contraception/Family planning: status post hysterectomy  Health Maintenance Last Pap: No longer screening per guidelines Last mammogram: 09/30/2019. Results were: normal Last colonoscopy: 05/2020. Results were: polyps Last Dexa: 2012. Results were: normal  Past medical history, past surgical history, family history and social history were all reviewed and documented in the EPIC chart.  ROS:  A ROS was performed and pertinent positives and negatives are included.  Exam:  Vitals:   11/16/20 1547  BP: 130/80  Weight: 186 lb (84.4 kg)  Height: 5\' 5"  (1.651 m)   Body mass index is 30.95 kg/m.  General appearance:  Normal Thyroid:  Symmetrical, normal in size, without palpable masses or nodularity. Respiratory  Auscultation:  Clear without wheezing or rhonchi Cardiovascular  Auscultation:  Regular rate, without rubs, murmurs or gallops  Edema/varicosities:  Not grossly evident Abdominal  Soft,nontender, without masses, guarding or rebound.  Liver/spleen:  No organomegaly noted  Hernia:  None appreciated  Skin  Inspection:  Grossly normal   Breasts: Examined lying and sitting.   Right: Without masses, retractions, discharge or axillary adenopathy.   Left: Without masses, retractions, discharge or axillary adenopathy. Gentitourinary   Inguinal/mons:  Normal without inguinal adenopathy  External genitalia:  Normal  BUS/Urethra/Skene's glands:  Normal  Vagina:   Normal  Cervix:  Absent  Uterus:  Absent  Adnexa/parametria:     Rt: Without masses or tenderness.   Lt: Without masses or tenderness.  Anus and perineum: Normal  Digital rectal exam: Normal sphincter tone without palpated masses or tenderness  Assessment/Plan:  65 y.o. G2P0020 for annual exam.   Well female exam with routine gynecological exam - Education provided on SBEs, importance of preventative screenings, current guidelines, high calcium diet, regular exercise, and multivitamin daily. Labs with PCP.   Postmenopausal - Plan: DG Bone Density  History of laparoscopic-assisted vaginal hysterectomy - BSO - Plan: DG Bone Density, estradiol (CLIMARA - DOSED IN MG/24 HR) 0.05 mg/24hr patch weekly.   Hormone replacement therapy (HRT) - Plan: estradiol (CLIMARA - DOSED IN MG/24 HR) 0.05 mg/24hr patch weekly. She has tried to wean in the past but was unable to tolerate due to mood changes. She is aware of the risks for blood clots, heart attack, stroke, and breast cancer. She would like to continue. We did discuss trying to wean again. Refill x 1 year provided.   Screening for cervical cancer - Normal Pap history.  No longer screening per guidelines.   Screening for breast cancer - Normal mammogram history.  Continue annual screenings.  Normal breast exam today.  Screening for colon cancer - 2021 colonoscopy. Will repeat at GI's recommended interval.   Screening for osteoporosis - Normal DEXA in 2012. Will repeat DEXA.   Follow up in 1 year for annual.    Tamela Gammon [facility], 4:05 PM 11/16/2020

## 2020-11-16 NOTE — Patient Instructions (Signed)
Health Maintenance After Age 67 After age 67, you are at a higher risk for certain long-term diseases and infections as well as injuries from falls. Falls are a major cause of broken bones and head injuries in people who are older than age 67. Getting regular preventive care can help to keep you healthy and well. Preventive care includes getting regular testing and making lifestyle changes as recommended by your health care provider. Talk with your health care provider about:  Which screenings and tests you should have. A screening is a test that checks for a disease when you have no symptoms.  A diet and exercise plan that is right for you. What should I know about screenings and tests to prevent falls? Screening and testing are the best ways to find a health problem early. Early diagnosis and treatment give you the best chance of managing medical conditions that are common after age 67. Certain conditions and lifestyle choices may make you more likely to have a fall. Your health care provider may recommend:  Regular vision checks. Poor vision and conditions such as cataracts can make you more likely to have a fall. If you wear glasses, make sure to get your prescription updated if your vision changes.  Medicine review. Work with your health care provider to regularly review all of the medicines you are taking, including over-the-counter medicines. Ask your health care provider about any side effects that may make you more likely to have a fall. Tell your health care provider if any medicines that you take make you feel dizzy or sleepy.  Osteoporosis screening. Osteoporosis is a condition that causes the bones to get weaker. This can make the bones weak and cause them to break more easily.  Blood pressure screening. Blood pressure changes and medicines to control blood pressure can make you feel dizzy.  Strength and balance checks. Your health care provider may recommend certain tests to check your  strength and balance while standing, walking, or changing positions.  Foot health exam. Foot pain and numbness, as well as not wearing proper footwear, can make you more likely to have a fall.  Depression screening. You may be more likely to have a fall if you have a fear of falling, feel emotionally low, or feel unable to do activities that you used to do.  Alcohol use screening. Using too much alcohol can affect your balance and may make you more likely to have a fall. What actions can I take to lower my risk of falls? General instructions  Talk with your health care provider about your risks for falling. Tell your health care provider if: ? You fall. Be sure to tell your health care provider about all falls, even ones that seem minor. ? You feel dizzy, sleepy, or off-balance.  Take over-the-counter and prescription medicines only as told by your health care provider. These include any supplements.  Eat a healthy diet and maintain a healthy weight. A healthy diet includes low-fat dairy products, low-fat (lean) meats, and fiber from whole grains, beans, and lots of fruits and vegetables. Home safety  Remove any tripping hazards, such as rugs, cords, and clutter.  Install safety equipment such as grab bars in bathrooms and safety rails on stairs.  Keep rooms and walkways well-lit. Activity  Follow a regular exercise program to stay fit. This will help you maintain your balance. Ask your health care provider what types of exercise are appropriate for you.  If you need a cane or walker,   use it as recommended by your health care provider.  Wear supportive shoes that have nonskid soles.   Lifestyle  Do not drink alcohol if your health care provider tells you not to drink.  If you drink alcohol, limit how much you have: ? 0-1 drink a day for women. ? 0-2 drinks a day for men.  Be aware of how much alcohol is in your drink. In the [place] one drink equals one typical bottle of beer (12  oz), one-half glass of wine (5 oz), or one shot of hard liquor (1 oz).  Do not use any products that contain nicotine or tobacco, such as cigarettes and e-cigarettes. If you need help quitting, ask your health care provider. Summary  Having a healthy lifestyle and getting preventive care can help to protect your health and wellness after age 67.  Screening and testing are the best way to find a health problem early and help you avoid having a fall. Early diagnosis and treatment give you the best chance for managing medical conditions that are more common for people who are older than age 67.  Falls are a major cause of broken bones and head injuries in people who are older than age 67. Take precautions to prevent a fall at home.  Work with your health care provider to learn what changes you can make to improve your health and wellness and to prevent falls. This information is not intended to replace advice given to you by your health care provider. Make sure you discuss any questions you have with your health care provider. Document Revised: 12/27/2018 Document Reviewed: 07/19/2017 Elsevier Patient Education  2021 Elsevier Inc.  

## 2020-12-14 ENCOUNTER — Other Ambulatory Visit: Payer: Self-pay | Admitting: Family Medicine

## 2020-12-14 DIAGNOSIS — R918 Other nonspecific abnormal finding of lung field: Secondary | ICD-10-CM

## 2020-12-24 ENCOUNTER — Ambulatory Visit
Admission: RE | Admit: 2020-12-24 | Discharge: 2020-12-24 | Disposition: A | Discharge status: Home / Self Care | Payer: Medicare Other | Source: Ambulatory Visit | Attending: Family Medicine | Admitting: Family Medicine

## 2020-12-24 ENCOUNTER — Other Ambulatory Visit: Payer: Self-pay

## 2020-12-24 DIAGNOSIS — R918 Other nonspecific abnormal finding of lung field: Secondary | ICD-10-CM

## 2020-12-24 MED ORDER — IOPAMIDOL (ISOVUE-370) INJECTION 76%
75.0000 mL | Freq: Once | INTRAVENOUS | Status: AC | PRN
  Administered 2020-12-24: 75 mL via INTRAVENOUS

## 2021-01-22 ENCOUNTER — Other Ambulatory Visit: Payer: Self-pay

## 2021-03-29 ENCOUNTER — Encounter (HOSPITAL_COMMUNITY): Payer: Self-pay

## 2021-03-29 ENCOUNTER — Emergency Department (HOSPITAL_COMMUNITY): Payer: Medicare Other

## 2021-03-29 ENCOUNTER — Emergency Department (HOSPITAL_COMMUNITY)
Admission: EM | Admit: 2021-03-29 | Discharge: 2021-03-29 | Disposition: A | Discharge status: Home / Self Care | Payer: Medicare Other | Attending: Emergency Medicine | Admitting: Emergency Medicine

## 2021-03-29 DIAGNOSIS — N201 Calculus of ureter: Secondary | ICD-10-CM | POA: Diagnosis not present

## 2021-03-29 DIAGNOSIS — E039 Hypothyroidism, unspecified: Secondary | ICD-10-CM | POA: Diagnosis not present

## 2021-03-29 DIAGNOSIS — J45909 Unspecified asthma, uncomplicated: Secondary | ICD-10-CM | POA: Insufficient documentation

## 2021-03-29 DIAGNOSIS — Z96653 Presence of artificial knee joint, bilateral: Secondary | ICD-10-CM | POA: Insufficient documentation

## 2021-03-29 DIAGNOSIS — R1030 Lower abdominal pain, unspecified: Secondary | ICD-10-CM | POA: Diagnosis present

## 2021-03-29 DIAGNOSIS — Z87891 Personal history of nicotine dependence: Secondary | ICD-10-CM | POA: Diagnosis not present

## 2021-03-29 DIAGNOSIS — Z79899 Other long term (current) drug therapy: Secondary | ICD-10-CM | POA: Insufficient documentation

## 2021-03-29 DIAGNOSIS — Z85828 Personal history of other malignant neoplasm of skin: Secondary | ICD-10-CM | POA: Diagnosis not present

## 2021-03-29 LAB — URINALYSIS, ROUTINE W REFLEX MICROSCOPIC
Bilirubin Urine: NEGATIVE
Glucose, UA: NEGATIVE mg/dL
Ketones, ur: NEGATIVE mg/dL
Leukocytes,Ua: NEGATIVE
Nitrite: NEGATIVE
Protein, ur: NEGATIVE mg/dL
Specific Gravity, Urine: 1.004 — ABNORMAL LOW (ref 1.005–1.030)
pH: 7 (ref 5.0–8.0)

## 2021-03-29 LAB — CBC WITH DIFFERENTIAL/PLATELET
Abs Immature Granulocytes: 0.03 10*3/uL (ref 0.00–0.07)
Basophils Absolute: 0.1 10*3/uL (ref 0.0–0.1)
Basophils Relative: 1 %
Eosinophils Absolute: 0.1 10*3/uL (ref 0.0–0.5)
Eosinophils Relative: 1 %
HCT: 42.6 % (ref 36.0–46.0)
Hemoglobin: 14.3 g/dL (ref 12.0–15.0)
Immature Granulocytes: 0 %
Lymphocytes Relative: 12 %
Lymphs Abs: 1.1 10*3/uL (ref 0.7–4.0)
MCH: 32.1 pg (ref 26.0–34.0)
MCHC: 33.6 g/dL (ref 30.0–36.0)
MCV: 95.5 fL (ref 80.0–100.0)
Monocytes Absolute: 0.6 10*3/uL (ref 0.1–1.0)
Monocytes Relative: 6 %
Neutro Abs: 7.5 10*3/uL (ref 1.7–7.7)
Neutrophils Relative %: 80 %
Platelets: 270 10*3/uL (ref 150–400)
RBC: 4.46 MIL/uL (ref 3.87–5.11)
RDW: 12.8 % (ref 11.5–15.5)
WBC: 9.5 10*3/uL (ref 4.0–10.5)
nRBC: 0 % (ref 0.0–0.2)

## 2021-03-29 LAB — COMPREHENSIVE METABOLIC PANEL
ALT: 35 U/L (ref 0–44)
AST: 36 U/L (ref 15–41)
Albumin: 4.5 g/dL (ref 3.5–5.0)
Alkaline Phosphatase: 81 U/L (ref 38–126)
Anion gap: 8 (ref 5–15)
BUN: 17 mg/dL (ref 8–23)
CO2: 26 mmol/L (ref 22–32)
Calcium: 9.4 mg/dL (ref 8.9–10.3)
Chloride: 104 mmol/L (ref 98–111)
Creatinine, Ser: 0.99 mg/dL (ref 0.44–1.00)
GFR, Estimated: >60 mL/min (ref >60)
Glucose, Bld: 120 mg/dL — ABNORMAL HIGH (ref 70–99)
Potassium: 3.7 mmol/L (ref 3.5–5.1)
Sodium: 138 mmol/L (ref 135–145)
Total Bilirubin: 0.8 mg/dL (ref 0.3–1.2)
Total Protein: 7.2 g/dL (ref 6.5–8.1)

## 2021-03-29 LAB — LIPASE, BLOOD: Lipase: 41 U/L (ref 11–51)

## 2021-03-29 MED ORDER — NAPROXEN 500 MG PO TABS: 500.0000 mg | ORAL_TABLET | Freq: Two times a day (BID) | ORAL | 0 refills | Status: DC

## 2021-03-29 MED ORDER — SODIUM CHLORIDE (PF) 0.9 % IJ SOLN
INTRAMUSCULAR | Status: AC
  Filled 2021-03-29: qty 50

## 2021-03-29 MED ORDER — IOHEXOL 350 MG/ML SOLN
80.0000 mL | Freq: Once | INTRAVENOUS | Status: AC | PRN
  Administered 2021-03-29: 80 mL via INTRAVENOUS

## 2021-03-29 MED ORDER — ONDANSETRON HCL 4 MG/2ML IJ SOLN
4.0000 mg | Freq: Once | INTRAMUSCULAR | Status: AC
  Administered 2021-03-29: 2 mg via INTRAVENOUS
  Filled 2021-03-29: qty 2

## 2021-03-29 MED ORDER — MORPHINE SULFATE (PF) 4 MG/ML IV SOLN
4.0000 mg | Freq: Once | INTRAVENOUS | Status: AC
  Administered 2021-03-29: 4 mg via INTRAVENOUS
  Filled 2021-03-29: qty 1

## 2021-03-29 MED ORDER — TRAMADOL HCL 50 MG PO TABS: 50.0000 mg | ORAL_TABLET | Freq: Four times a day (QID) | ORAL | 0 refills | Status: DC | PRN

## 2021-03-29 MED ORDER — KETOROLAC TROMETHAMINE 30 MG/ML IJ SOLN
30.0000 mg | Freq: Once | INTRAMUSCULAR | Status: AC
  Administered 2021-03-29: 30 mg via INTRAVENOUS
  Filled 2021-03-29: qty 1

## 2021-03-29 MED ORDER — SODIUM CHLORIDE 0.9 % IV BOLUS
1000.0000 mL | Freq: Once | INTRAVENOUS | Status: AC
  Administered 2021-03-29: 1000 mL via INTRAVENOUS

## 2021-03-29 NOTE — ED Notes (Signed)
Provided pt with urine strainer and hat

## 2021-03-29 NOTE — ED Triage Notes (Signed)
Pt presents to ED with UTI sx - pain in lower abd and bilateral back. Denies hematuria. Endorses burning when finishing urinating starting yesterday. Been taking aleve. Denies fevers, chills, other complaints.

## 2021-03-29 NOTE — ED Provider Notes (Signed)
[facility]GENCY DEPARTMENT Provider Note  CSN: [id number] Arrival date & time: 03/29/21 9937    History Chief Complaint  Patient presents with   Abdominal Pain   Dysuria    Elaine Gray is a 65 y.o. female with history of endometriosis, s/p hysterectomy/oophorectomy many years ago, complicated by scar tissue and SBO in the remote past. She reports sudden onset of bilateral mid back pain, suprapubic abdominal pain and dysuria last night. She has not had any vomiting or fever. Pain is sharp, severe and helped temporarily by Aleve. This morning she felt flushed and dizzy while going to the bathroom and so her husband called EMS. They evaluated her but did not feel she needed transportation to the ED so [facility] to local [facility] where a UA did not show signs of infection. She was advised to come to the ED for evaluation.    Past Medical History:  Diagnosis Date   ADD (attention deficit disorder)    Allergy    mild respiratory allergies   Arthritis    in neck, thumbs, knee and eyes    Asthma    Basal cell carcinoma 2005,2006   Chest-Leg   Chronic constipation    Colon polyps    tubular adenomatous   Diverticulosis    Endometriosis    Glaucoma    Seasonal allergies    Thyroid disease    hypothyroid    Past Surgical History:  Procedure Laterality Date   BASAL CELL CARCINOMA EXCISION  2005,2006   Chest-Leg   KNEE SURGERY     Arthroscopic   OOPHORECTOMY     PELVIC LAPAROSCOPY     Endometriosis   Plantarfaciotomy  2006   REPLACEMENT TOTAL KNEE     VAGINAL HYSTERECTOMY  1996   LAVH BSO    Family History  Problem Relation Age of Onset   Diabetes Mother    Hypertension Mother    Heart disease Mother    Uterine cancer Mother    Dementia Mother    Thyroid disease Mother    Colon polyps Mother    Hypertension Father    Heart disease Father    Diabetes Maternal Grandfather    Hypertension Maternal Grandfather    Heart disease Paternal Grandfather     Hypertension Paternal Grandfather    Glaucoma Maternal Grandmother    Hypertension Maternal Grandmother    Hypertension Sister    Thyroid disease Sister    Cancer Maternal Uncle        Pancreatic   Hypertension Paternal Grandmother    Heart disease Paternal Grandmother    Colon cancer Neg Hx     Social History   Tobacco Use   Smoking status: Former    Pack years: 0.00   Smokeless tobacco: Never  Vaping Use   Vaping Use: Never used  Substance Use Topics   Alcohol use: Yes    Alcohol/week: 0.0 standard drinks    Comment: social-Rare   Drug use: No     Home Medications Prior to Admission medications   Medication Sig Start Date End Date Taking? Authorizing Provider  Alpha-D-Galactosidase (ECK FOOD ENZYME PO) Take 1 capsule by mouth every evening. Food Enzyme Compound   Yes [provider]  AMINO ACIDS PO Take 1 capsule by mouth daily.    Yes [provider]  amphetamine-dextroamphetamine (ADDERALL XR) 25 MG 24 hr capsule Take 25 mg by mouth daily. 09/07/16  Yes [provider]  B Complex Vitamins (VITAMIN B COMPLEX PO)  Take 1 capsule by mouth daily.    Yes [provider]  Boswellia Serrata (BOSWELLIA PO) Take 1 capsule by mouth daily as needed (for arthritis pain).   Yes [provider]  brimonidine (ALPHAGAN) 0.2 % ophthalmic solution Place 1 drop into the left eye 2 (two) times daily. 03/27/21  Yes [provider]  Cholecalciferol (VITAMIN D) 2000 units CAPS Take 2,000 Units by mouth See admin instructions. (2-3x's weekly)   Yes [provider]  Coenzyme Q10 (COQ10) 100 MG CAPS Take 100 mg by mouth daily.    Yes [provider]  COMBIGAN 0.2-0.5 % ophthalmic solution Place 1 drop into the left eye 2 (two) times daily. 09/08/16  Yes [provider]  CREATINE PO Take 250 mg by mouth See admin instructions. (2-3x's weekly)   Yes [provider]  Cyanocobalamin (VITAMIN B-12 CR) 1000 MCG TBCR  Take 1,000 mcg by mouth daily.   Yes [provider]  estradiol (CLIMARA - DOSED IN MG/24 HR) 0.05 mg/24hr patch Place 1 patch (0.05 mg total) onto the skin once a week. Patient taking differently: Place 0.05 mg onto the skin once a week. Tuesday 11/16/20  Yes Marny Lowenstein A, NP  Fluticasone Furoate 200 MCG/ACT AEPB Inhale 1 puff into the lungs daily as needed (shortness of breath).   Yes [provider]  latanoprost (XALATAN) 0.005 % ophthalmic solution Place 1 drop into both eyes at bedtime.    Yes [provider]  levothyroxine (SYNTHROID) 88 MCG tablet Take 88 mcg by mouth daily at 6 (six) AM. 01/19/21  Yes [provider]  liothyronine (CYTOMEL) 5 MCG tablet Take 5 mcg by mouth daily. 01/19/21  Yes [provider]  magnesium oxide (MAG-OX) 400 MG tablet Take 400 mg by mouth every evening.   Yes [provider]  montelukast (SINGULAIR) 10 MG tablet Take 10 mg by mouth at bedtime.   Yes [provider]  naproxen (NAPROSYN) 500 MG tablet Take 1 tablet (500 mg total) by mouth 2 (two) times daily. 03/29/21  Yes Truddie Hidden, MD  Nutritional Supplements (DHEA PO) Take 100 mg by mouth daily.   Yes [provider]  Omega 3-6-9 Fatty Acids (OMEGA-3-6-9 PO) Take 1 capsule by mouth See admin instructions. (3-4x's weekly)   Yes [provider]  Probiotic Product (PROBIOTIC DAILY PO) Take 1 capsule by mouth every evening. Probiotic 50 billion Compound   Yes [provider]  Quercetin 250 MG TABS Take 250 mg by mouth See admin instructions. (2-3x's weekly)   Yes [provider]  sodium chloride (OCEAN) 0.65 % SOLN nasal spray Place 1 spray into both nostrils at bedtime.   Yes [provider]  traMADol (ULTRAM) 50 MG tablet Take 1 tablet (50 mg total) by mouth every 6 (six) hours as needed. 03/29/21  Yes Truddie Hidden, MD     Allergies    Codeine, Amoxicillin-pot clavulanate, Other, and  Sulfonamide derivatives   Review of Systems   Review of Systems A comprehensive review of systems was completed and negative except as noted in HPI.    Physical Exam BP (!) 160/79   Pulse 65   Temp 98.6 F (37 C) (Oral)   Resp 16   Ht 5' 6.5" (1.689 m)   Wt 82.6 kg   SpO2 98%   BMI 28.93 kg/m   Physical Exam Vitals and nursing note reviewed.  Constitutional:      Appearance: Normal appearance.  HENT:  Head: Normocephalic and atraumatic.     Nose: Nose normal.     Mouth/Throat:     Mouth: Mucous membranes are moist.  Eyes:     Extraocular Movements: Extraocular movements intact.     Conjunctiva/sclera: Conjunctivae normal.  Cardiovascular:     Rate and Rhythm: Normal rate.  Pulmonary:     Effort: Pulmonary effort is normal.     Breath sounds: Normal breath sounds.  Abdominal:     General: Abdomen is flat.     Palpations: Abdomen is soft.     Tenderness: There is abdominal tenderness in the suprapubic area. There is no guarding. Negative signs include Murphy's sign and McBurney's sign.  Musculoskeletal:        General: No swelling. Normal range of motion.     Cervical back: Neck supple.  Skin:    General: Skin is warm and dry.  Neurological:     General: No focal deficit present.     Mental Status: She is alert.  Psychiatric:        Mood and Affect: Mood normal.     ED Results / Procedures / Treatments   Labs (all labs ordered are listed, but only abnormal results are displayed) Labs Reviewed  COMPREHENSIVE METABOLIC PANEL - Abnormal; Notable for the following components:      Result Value   Glucose, Bld 120 (*)    All other components within normal limits  URINALYSIS, ROUTINE W REFLEX MICROSCOPIC - Abnormal; Notable for the following components:   Color, Urine STRAW (*)    Specific Gravity, Urine 1.004 (*)    Hgb urine dipstick SMALL (*)    Bacteria, UA RARE (*)    All other components within normal limits  LIPASE, BLOOD  CBC WITH  DIFFERENTIAL/PLATELET    EKG None  Radiology CT Abdomen Pelvis W Contrast  Result Date: 03/29/2021 CLINICAL DATA:  Abdominal abscess/infection suspected EXAM: CT ABDOMEN AND PELVIS WITH CONTRAST TECHNIQUE: Multidetector CT imaging of the abdomen and pelvis was performed using the standard protocol following bolus administration of intravenous contrast. CONTRAST:  8mL OMNIPAQUE IOHEXOL 350 MG/ML SOLN COMPARISON:  02/01/2008 FINDINGS: Lower chest: No acute abnormality. Hepatobiliary: No focal hepatic abnormality. Gallbladder unremarkable. Pancreas: No focal abnormality or ductal dilatation. Spleen: No focal abnormality.  Normal size. Adrenals/Urinary Tract: 2 mm nonobstructing stone in the midpole of the left kidney. There is mild left hydronephrosis and perinephric stranding. Delayed excretion of contrast from the left kidney. 2 mm left UVJ stone suspected. No hydronephrosis or stones on the right. Adrenal glands and urinary bladder unremarkable. Stomach/Bowel: Sigmoid diverticulosis. No active diverticulitis. Normal appendix. Stomach and small bowel decompressed. Vascular/Lymphatic: Aortic atherosclerosis. Reproductive: Prior hysterectomy.  No adnexal masses. Other: No free fluid or free air. Musculoskeletal: No acute bony abnormality. IMPRESSION: 2 mm left UVJ stone with left hydronephrosis and perinephric stranding. Punctate left midpole nephrolithiasis. Sigmoid diverticulosis. Aortic atherosclerosis. Electronically Signed   By: Rolm Baptise M.D.   On: 03/29/2021 11:30    Procedures Procedures  Medications Ordered in the ED Medications  ondansetron (ZOFRAN) injection 4 mg (has no administration in time range)  sodium chloride (PF) 0.9 % injection (has no administration in time range)  ketorolac (TORADOL) 30 MG/ML injection 30 mg (has no administration in time range)  morphine 4 MG/ML injection 4 mg (4 mg Intravenous Given 03/29/21 1026)  sodium chloride 0.9 % bolus 1,000 mL (1,000 mLs  Intravenous New Bag/Given 03/29/21 1026)  iohexol (OMNIPAQUE) 350 MG/ML injection 80 mL (80 mLs Intravenous  Contrast Given 03/29/21 1106)     MDM Rules/Calculators/A&P MDM Patient with severe abdominal pain, no peritoneal signs on exam. Symptoms could be kidney stones, although it would be unusual to be bilateral. Will check blood work, send for CT. Morphine for pain.  ED Course  I have reviewed the triage vital signs and the nursing notes.  Pertinent labs & imaging results that were available during my care of the patient were reviewed by me and considered in my medical decision making (see chart for details).  Clinical Course as of 03/29/21 1253  Mon Mar 29, 2021  1026 CBC is normal.  [CS]  1057 CMP and lipase are normal.  [CS]  1116 UA with small Hgb, no signs of infection or significant hematuria.  [CS]  2174 CT images and results reviewed, patient has a small distal renal stone. Still having some pain, will give a dose of Toradol, plan discharge with Rx for Tramadol (this has worked well for her in the past), Naprosyn and Urology follow up.  [CS]    Clinical Course User Index [CS] Truddie Hidden, MD    Final Clinical Impression(s) / ED Diagnoses Final diagnoses:  Left ureteral stone    Rx / DC Orders ED Discharge Orders          Ordered    traMADol (ULTRAM) 50 MG tablet  Every 6 hours PRN        03/29/21 1252    naproxen (NAPROSYN) 500 MG tablet  2 times daily        03/29/21 1252             Truddie Hidden, MD 03/29/21 1253

## 2021-10-07 ENCOUNTER — Encounter: Payer: Self-pay | Admitting: Nurse Practitioner

## 2021-10-23 ENCOUNTER — Other Ambulatory Visit: Payer: Self-pay | Admitting: Nurse Practitioner

## 2021-10-23 DIAGNOSIS — Z90710 Acquired absence of both cervix and uterus: Secondary | ICD-10-CM

## 2021-10-23 DIAGNOSIS — Z79890 Hormone replacement therapy (postmenopausal): Secondary | ICD-10-CM

## 2021-10-25 NOTE — Telephone Encounter (Signed)
Aex scheduled on 11/17/21 Last aex was 10/2020

## 2021-11-17 ENCOUNTER — Other Ambulatory Visit: Payer: Self-pay

## 2021-11-17 ENCOUNTER — Ambulatory Visit (INDEPENDENT_AMBULATORY_CARE_PROVIDER_SITE_OTHER): Payer: Medicare Other | Admitting: Nurse Practitioner

## 2021-11-17 ENCOUNTER — Encounter: Payer: Self-pay | Admitting: Nurse Practitioner

## 2021-11-17 VITALS — BP 124/82 | Ht 64.5 in | Wt 186.0 lb

## 2021-11-17 DIAGNOSIS — Z79890 Hormone replacement therapy (postmenopausal): Secondary | ICD-10-CM

## 2021-11-17 DIAGNOSIS — Z780 Asymptomatic menopausal state: Secondary | ICD-10-CM

## 2021-11-17 DIAGNOSIS — Z01419 Encounter for gynecological examination (general) (routine) without abnormal findings: Secondary | ICD-10-CM

## 2021-11-17 DIAGNOSIS — Z9189 Other specified personal risk factors, not elsewhere classified: Secondary | ICD-10-CM

## 2021-11-17 MED ORDER — ESTRADIOL 0.05 MG/24HR TD PTWK: 0.0500 mg | MEDICATED_PATCH | TRANSDERMAL | 3 refills | Status: DC

## 2021-11-17 NOTE — Progress Notes (Signed)
? ?  Elaine Gray [birth year] [id number] ? ? ?History:  66 y.o. G2P0020 presents for breast and pelvic exam. Postmenopausal - on ERT. S/P 1996 LAVH BSO for endometriosis. Normal pap and mammogram history. Hypothyroidism managed by PCP.  ? ?Gynecologic History ?No LMP recorded. Patient has had a hysterectomy. ?  ?Contraception/Family planning: status post hysterectomy ?Sexually active: Yes ? ?Health Maintenance ?Last Pap: 07/20/2017. Results were: Normal ?Last mammogram: 10/07/2021. Results were: Normal ?Last colonoscopy: 05/2020. Results were: Normal, 5-year recall ?Last Dexa: 2012. Results were: Normal ? ?Past medical history, past surgical history, family history and social history were all reviewed and documented in the EPIC chart. Married. Works in Insurance underwriter, remote.  ? ?ROS:  A ROS was performed and pertinent positives and negatives are included. ? ?Exam: ? ?Vitals:  ? 11/17/21 1612  ?BP: 124/82  ?Weight: 186 lb (84.4 kg)  ?Height: 5' 4.5" (1.638 m)  ? ? ?Body mass index is 31.43 kg/m?. ? ?General appearance:  Normal ?Thyroid:  Symmetrical, normal in size, without palpable masses or nodularity. ?Respiratory ? Auscultation:  Clear without wheezing or rhonchi ?Cardiovascular ? Auscultation:  Regular rate, without rubs, murmurs or gallops ? Edema/varicosities:  Not grossly evident ?Abdominal ? Soft,nontender, without masses, guarding or rebound. ? Liver/spleen:  No organomegaly noted ? Hernia:  None appreciated ? Skin ? Inspection:  Grossly normal ?  ?Breasts: Examined lying and sitting.  ? Right: Without masses, retractions, discharge or axillary adenopathy. ? ? Left: Without masses, retractions, discharge or axillary adenopathy. ?Genitourinary  ? Inguinal/mons:  Normal without inguinal adenopathy ? External genitalia:  Normal appearing vulva with no masses, tenderness, or lesions ? BUS/Urethra/Skene's glands:  Normal ? Vagina:  Normal appearing with normal color and discharge, no lesions ? Cervix:   Absent ? Uterus:  Absent ? Adnexa/parametria:   ?  Rt: Normal in size, without masses or tenderness. ?  Lt: Normal in size, without masses or tenderness. ? Anus and perineum: Normal ? Digital rectal exam: Normal sphincter tone without palpated masses or tenderness ? ?Patient informed chaperone available to be present for breast and pelvic exam. Patient has requested no chaperone to be present. Patient has been advised what will be completed during breast and pelvic exam.  ? ?Assessment/Plan:  66 y.o. G2P0020 for annual exam.  ? ?Well female exam with routine gynecological exam - Education provided on SBEs, importance of preventative screenings, current guidelines, high calcium diet, regular exercise, and multivitamin daily.  Labs with PCP.  ? ?Postmenopausal hormone therapy - Plan: estradiol (CLIMARA - DOSED IN MG/24 HR) 0.05 mg/24hr patch weekly. She has tried to wean in the past but was unable to tolerate due to mood changes. She is aware of the risks for blood clots, heart attack, stroke, and breast cancer. She would like to continue. We did discuss trying to wean again. Refill x 1 year provided.  ? ?Postmenopausal - Plan: DG Bone Density. On ERT. S/P LAVH BSO ? ?Screening for cervical cancer - Normal Pap history.  No longer screening per guidelines.  ? ?Screening for breast cancer - Normal mammogram history.  Continue annual screenings.  Normal breast exam today. ? ?Screening for colon cancer - 2021 colonoscopy. Will repeat at GI's recommended interval.  ? ?Screening for osteoporosis - Normal DEXA in 2012. Will repeat DEXA now.  ? ?Follow up in 1 year for annual.  ? ? ? ? ?Tamela Gammon [facility], 4:48 PM 11/17/2021 ? ?

## 2022-04-19 ENCOUNTER — Ambulatory Visit (INDEPENDENT_AMBULATORY_CARE_PROVIDER_SITE_OTHER): Payer: Medicare Other

## 2022-04-19 ENCOUNTER — Other Ambulatory Visit: Payer: Self-pay | Admitting: Nurse Practitioner

## 2022-04-19 DIAGNOSIS — Z780 Asymptomatic menopausal state: Secondary | ICD-10-CM

## 2022-04-19 DIAGNOSIS — Z13820 Encounter for screening for osteoporosis: Secondary | ICD-10-CM

## 2022-11-23 ENCOUNTER — Ambulatory Visit (INDEPENDENT_AMBULATORY_CARE_PROVIDER_SITE_OTHER): Payer: Medicare Other | Admitting: Nurse Practitioner

## 2022-11-23 ENCOUNTER — Encounter: Payer: Self-pay | Admitting: Nurse Practitioner

## 2022-11-23 VITALS — BP 138/80 | Ht 65.0 in | Wt 183.0 lb

## 2022-11-23 DIAGNOSIS — Z9189 Other specified personal risk factors, not elsewhere classified: Secondary | ICD-10-CM | POA: Diagnosis not present

## 2022-11-23 DIAGNOSIS — R3915 Urgency of urination: Secondary | ICD-10-CM | POA: Diagnosis not present

## 2022-11-23 DIAGNOSIS — Z01419 Encounter for gynecological examination (general) (routine) without abnormal findings: Secondary | ICD-10-CM

## 2022-11-23 DIAGNOSIS — Z79890 Hormone replacement therapy (postmenopausal): Secondary | ICD-10-CM | POA: Diagnosis not present

## 2022-11-23 MED ORDER — ESTRADIOL 0.05 MG/24HR TD PTWK: 0.0500 mg | MEDICATED_PATCH | TRANSDERMAL | 3 refills | Status: DC

## 2022-11-23 NOTE — Progress Notes (Signed)
   GWENDY REDHEAD [birth year] [id number]   History:  67 y.o. G2P0020 presents for breast and pelvic exam. Postmenopausal - on ERT. Was 4 days late changing patch and had significant fatigue. S/P 1996 LAVH BSO for endometriosis. Normal pap and mammogram history. Hypothyroidism managed by PCP. Urinary urgency without dysuria, frequency or hematuria.   Gynecologic History No LMP recorded. Patient has had a hysterectomy.   Contraception/Family planning: status post hysterectomy Sexually active: Yes  Health Maintenance Last Pap: 07/20/2017. Results were: Normal Last mammogram: 10/07/2021. Results were: Normal Last colonoscopy: 05/2020. Results were: Normal, 5-year recall Last Dexa: 04/19/2022. Results were: Normal  Past medical history, past surgical history, family history and social history were all reviewed and documented in the EPIC chart. Married. Works in Insurance underwriter, remote.   ROS:  A ROS was performed and pertinent positives and negatives are included.  Exam:  Vitals:   11/23/22 1344  BP: 138/80  Weight: 183 lb (83 kg)  Height: '5\' 5"'$  (1.651 m)    Body mass index is 30.45 kg/m.  General appearance:  Normal Thyroid:  Symmetrical, normal in size, without palpable masses or nodularity. Respiratory  Auscultation:  Clear without wheezing or rhonchi Cardiovascular  Auscultation:  Regular rate, without rubs, murmurs or gallops  Edema/varicosities:  Not grossly evident Abdominal  Soft,nontender, without masses, guarding or rebound.  Liver/spleen:  No organomegaly noted  Hernia:  None appreciated  Skin  Inspection:  Grossly normal   Breasts: Examined lying and sitting.   Right: Without masses, retractions, discharge or axillary adenopathy.   Left: Without masses, retractions, discharge or axillary adenopathy. Genitourinary   Inguinal/mons:  Normal without inguinal adenopathy  External genitalia:  Normal appearing vulva with no masses, tenderness, or  lesions  BUS/Urethra/Skene's glands:  Normal  Vagina:  Normal appearing with normal color and discharge, no lesions  Cervix:  Absent  Uterus:  Absent  Adnexa/parametria:     Rt: Normal in size, without masses or tenderness.   Lt: Normal in size, without masses or tenderness.  Anus and perineum: Normal  Digital rectal exam: Normal sphincter tone without palpated masses or tenderness  Patient informed chaperone available to be present for breast and pelvic exam. Patient has requested no chaperone to be present. Patient has been advised what will be completed during breast and pelvic exam.   Assessment/Plan:  67 y.o. G2P0020 for annual exam.   Well female exam with routine gynecological exam - Education provided on SBEs, importance of preventative screenings, current guidelines, high calcium diet, regular exercise, and multivitamin daily.  Labs with PCP.   Postmenopausal hormone therapy - Plan: estradiol (CLIMARA - DOSED IN MG/24 HR) 0.05 mg/24hr patch weekly. Was late changing patch recently and experienced severe fatigue. She is aware of the risks for blood clots, heart attack, stroke, and breast cancer. She would like to continue. Refill x 1 year provided.   Postmenopausal - On ERT. S/P LAVH BSO  Screening for cervical cancer - Normal Pap history.  No longer screening per guidelines.   Screening for breast cancer - Normal mammogram history. Prefers screenings every 2 years. Normal breast exam today.  Screening for colon cancer - 2021 colonoscopy. Will repeat at GI's recommended interval.   Screening for osteoporosis - Normal bone density last year.   Follow up in 1 year for breast and pelvic exam.      Tamela Gammon [facility], 2:09 PM 11/23/2022

## 2022-11-25 LAB — URINE CULTURE
MICRO NUMBER:: 14656781
Result:: NO GROWTH
SPECIMEN QUALITY:: ADEQUATE

## 2022-11-25 LAB — URINALYSIS, COMPLETE W/RFL CULTURE
Bilirubin Urine: NEGATIVE
Glucose, UA: NEGATIVE
Hgb urine dipstick: NEGATIVE
Hyaline Cast: NONE SEEN /LPF
Ketones, ur: NEGATIVE
Leukocyte Esterase: NEGATIVE
Nitrites, Initial: NEGATIVE
Protein, ur: NEGATIVE
RBC / HPF: NONE SEEN /HPF (ref 0–2)
Specific Gravity, Urine: 1.015 (ref 1.001–1.035)
pH: 6.5 (ref 5.0–8.0)

## 2022-11-25 LAB — CULTURE INDICATED

## 2022-12-05 ENCOUNTER — Encounter: Payer: Self-pay | Admitting: Nurse Practitioner

## 2023-02-01 LAB — LAB REPORT - SCANNED
A1c: 5.4
EGFR (Non-African Amer.): 76
Free T4: 7.7 ng/dL
TSH: 1.34 (ref 0.41–5.90)

## 2023-05-15 IMAGING — CT CT ABD-PELV W/ CM
3 of 5 series · 17 of 46 positions shown, 19 images · IV contrast (omnipaque)
Comparison: 02/01/2008

CLINICAL DATA: Abdominal abscess/infection suspected

EXAM:
CT ABDOMEN AND PELVIS WITH CONTRAST
TECHNIQUE: Multidetector CT imaging of the abdomen and pelvis was performed
using the standard protocol following bolus administration of
intravenous contrast.
CONTRAST:  80mL OMNIPAQUE IOHEXOL 350 MG/ML SOLN

[Series 2: axial st · axial · 0.79mm/px · z∈[+1222,+1608]mm · 12 of 91 slices shown, 14 images]
[im 7/91  soft-tissue]
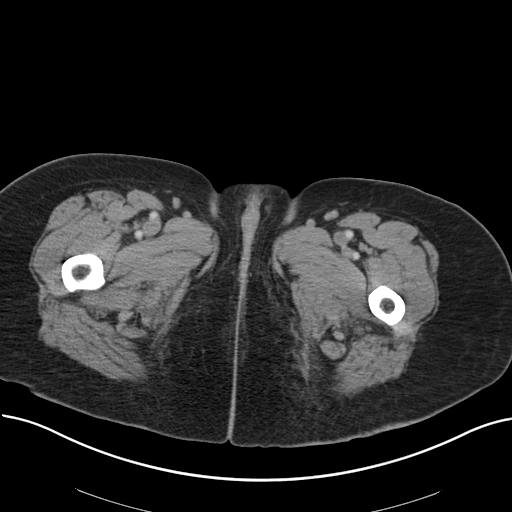
[im 7/91  bone]
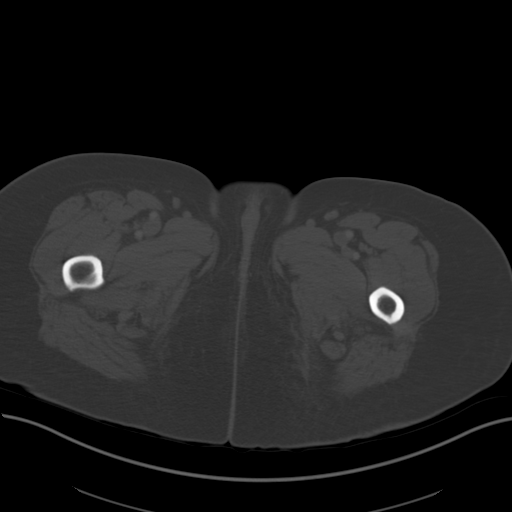
[im 14/91  soft-tissue]
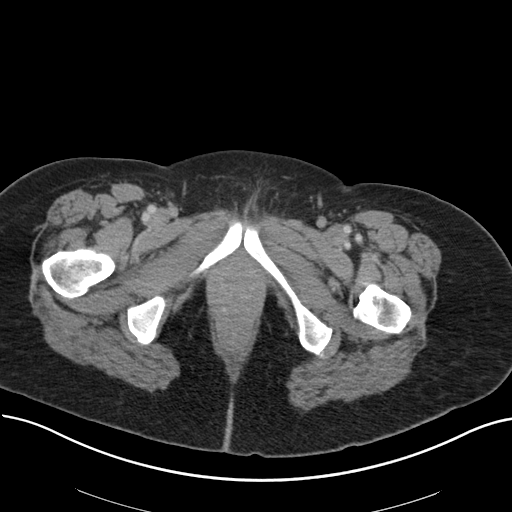
[im 21/91  soft-tissue]
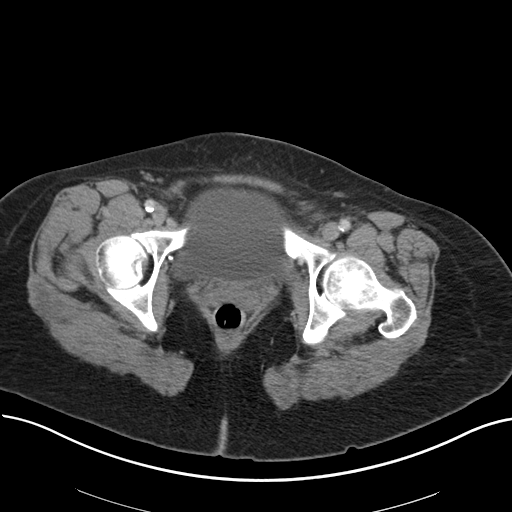
[im 28/91  soft-tissue]
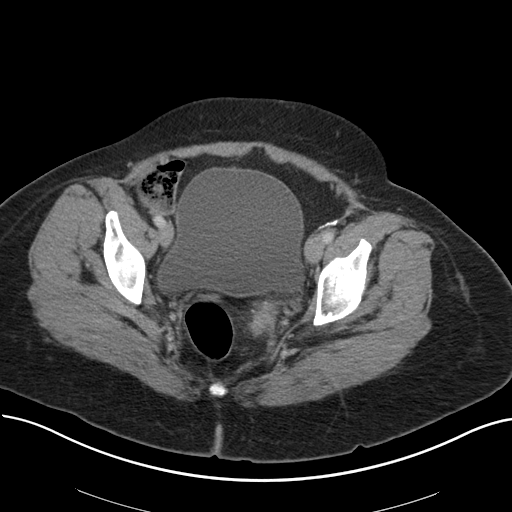
[im 35/91  soft-tissue]
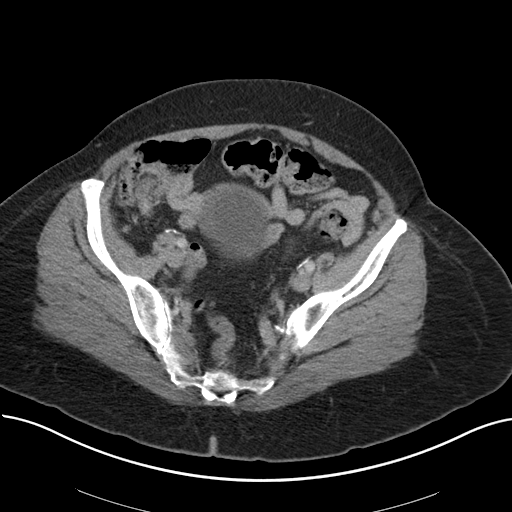
[im 42/91  soft-tissue]
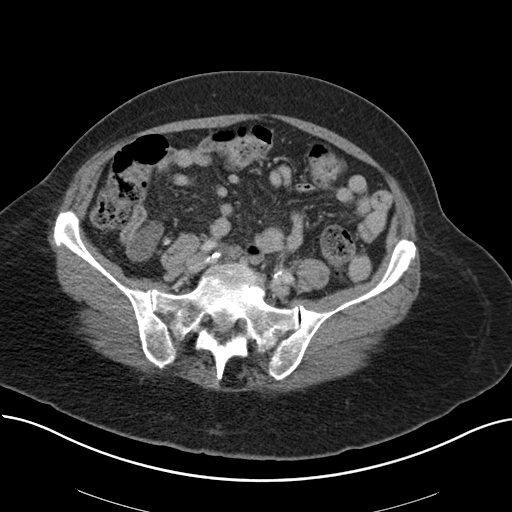
[im 49/91  soft-tissue]
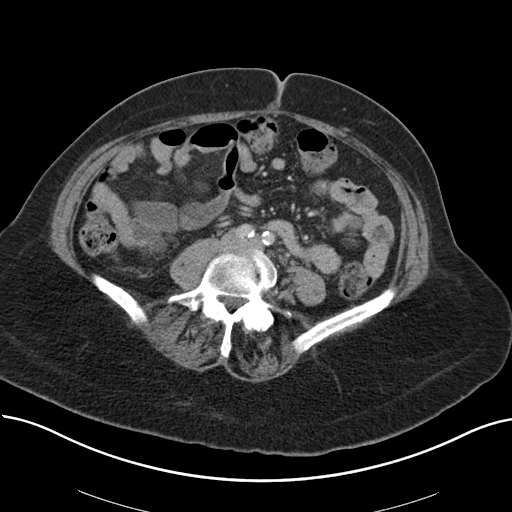
[im 56/91  soft-tissue]
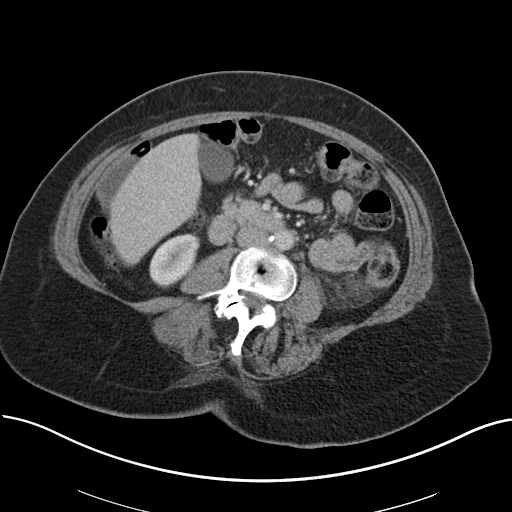
[im 63/91  soft-tissue]
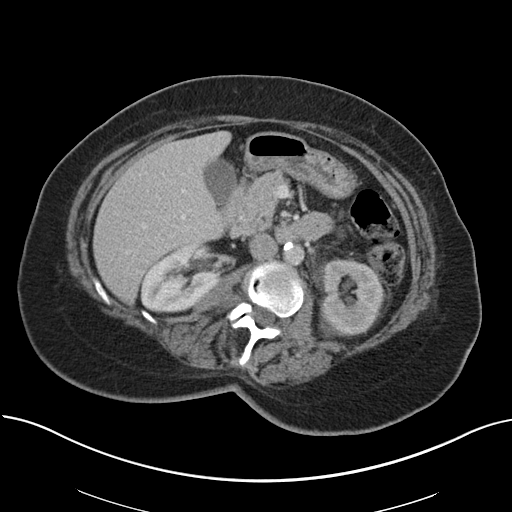
[im 63/91  bone]
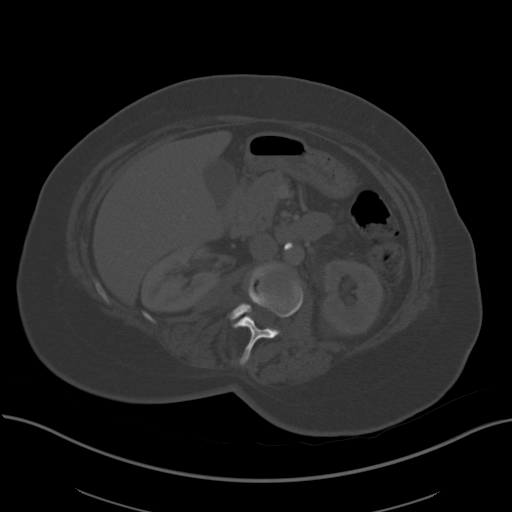
[im 70/91  soft-tissue]
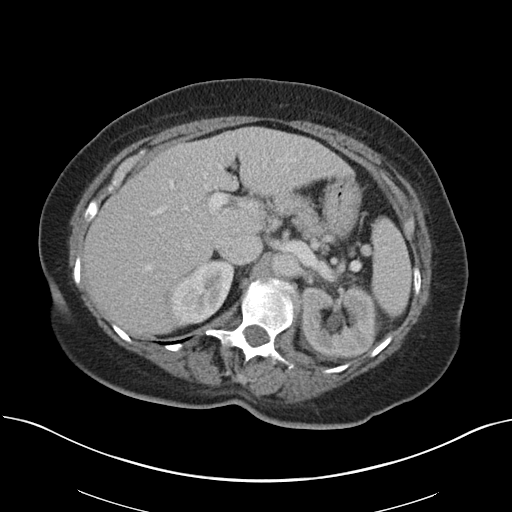
[im 77/91  soft-tissue]
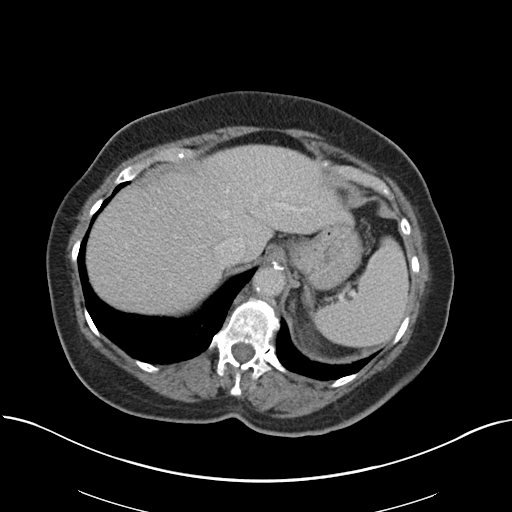
[im 84/91  soft-tissue]
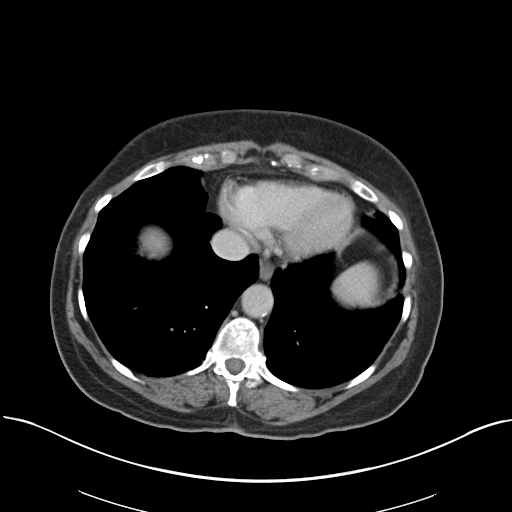

[Series 4: coronal st · coronal · 0.91mm/px · 3 of 146 slices shown]
[im 49/146  soft-tissue]
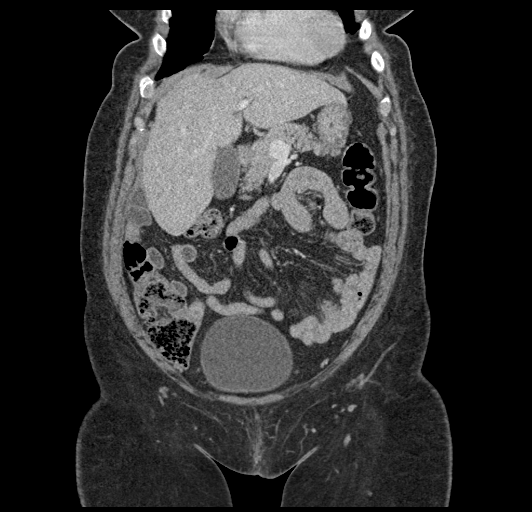
[im 65/146  soft-tissue]
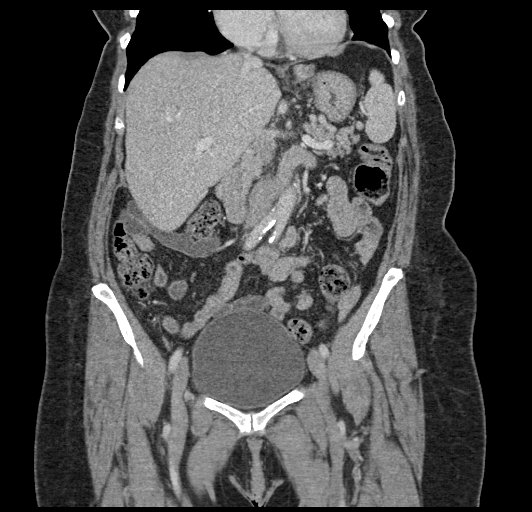
[im 81/146  soft-tissue]
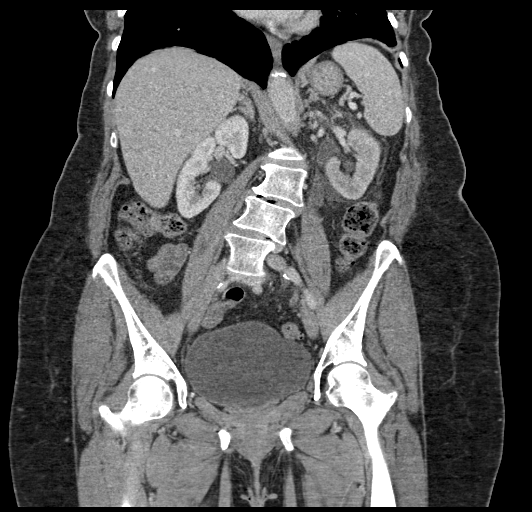

[Series 6: lung bases · axial · 0.79mm/px · z∈[+1494,+1522]mm · 2 of 81 slices shown]
[im 7/81  bone]
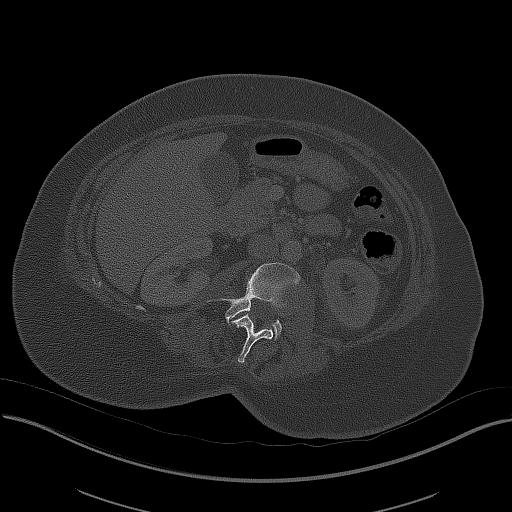
[im 21/81  bone]
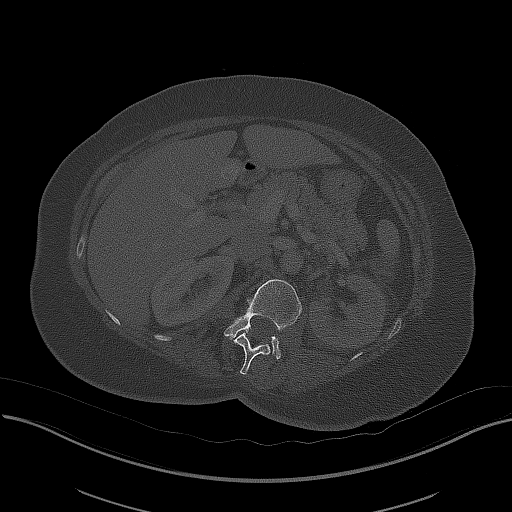

[17 of 46 positions shown; findings below may reference images not displayed]

FINDINGS: Lower chest: No acute abnormality.

Hepatobiliary: No focal hepatic abnormality. Gallbladder
unremarkable.

Pancreas: No focal abnormality or ductal dilatation.

Spleen: No focal abnormality.  Normal size.

Adrenals/Urinary Tract: 2 mm nonobstructing stone in the midpole of
the left kidney. There is mild left hydronephrosis and perinephric
stranding. Delayed excretion of contrast from the left kidney. 2 mm
left UVJ stone suspected. No hydronephrosis or stones on the right.
Adrenal glands and urinary bladder unremarkable.

Stomach/Bowel: Sigmoid diverticulosis. No active diverticulitis.
Normal appendix. Stomach and small bowel decompressed.

Vascular/Lymphatic: Aortic atherosclerosis.

Reproductive: Prior hysterectomy.  No adnexal masses.

Other: No free fluid or free air.

Musculoskeletal: No acute bony abnormality.
IMPRESSION: 2 mm left UVJ stone with left hydronephrosis and perinephric
stranding.

Punctate left midpole nephrolithiasis.

Sigmoid diverticulosis.

Aortic atherosclerosis.

## 2023-06-01 ENCOUNTER — Telehealth: Payer: Self-pay | Admitting: *Deleted

## 2023-06-01 ENCOUNTER — Ambulatory Visit (INDEPENDENT_AMBULATORY_CARE_PROVIDER_SITE_OTHER): Payer: Medicare Other | Admitting: Nurse Practitioner

## 2023-06-01 ENCOUNTER — Encounter: Payer: Self-pay | Admitting: Nurse Practitioner

## 2023-06-01 VITALS — BP 130/80 | HR 78 | Wt 185.0 lb

## 2023-06-01 DIAGNOSIS — R102 Pelvic and perineal pain: Secondary | ICD-10-CM | POA: Diagnosis not present

## 2023-06-01 DIAGNOSIS — N952 Postmenopausal atrophic vaginitis: Secondary | ICD-10-CM

## 2023-06-01 DIAGNOSIS — R3915 Urgency of urination: Secondary | ICD-10-CM | POA: Diagnosis not present

## 2023-06-01 LAB — URINALYSIS, COMPLETE W/RFL CULTURE
Bacteria, UA: NONE SEEN /HPF
Bilirubin Urine: NEGATIVE
Glucose, UA: NEGATIVE
Hgb urine dipstick: NEGATIVE
Hyaline Cast: NONE SEEN /LPF
Ketones, ur: NEGATIVE
Leukocyte Esterase: NEGATIVE
Nitrites, Initial: NEGATIVE
Protein, ur: NEGATIVE
RBC / HPF: NONE SEEN /HPF (ref 0–2)
Specific Gravity, Urine: 1.01 (ref 1.001–1.035)
WBC, UA: NONE SEEN /HPF (ref 0–5)
pH: 6.5 (ref 5.0–8.0)

## 2023-06-01 LAB — NO CULTURE INDICATED

## 2023-06-01 MED ORDER — ESTRADIOL 0.1 MG/GM VA CREA: 1.0000 g | TOPICAL_CREAM | VAGINAL | 0 refills | Status: DC

## 2023-06-01 NOTE — Telephone Encounter (Signed)
Patient left voicemail on triage line stating she was seen yesterday at urgent care for "raging" UTI symptoms. States urgency, pain/discomfort with urination, confusion and abdominal distention.

## 2023-06-01 NOTE — Progress Notes (Signed)
   Acute Office Visit  Subjective:    Patient ID: Elaine Gray, female    DOB: Jun 18, 1956, 67 y.o.   MRN: [id number]   HPI 67 y.o. presents today for urinary urgency and odor. Feels she has a UTI but has had multiple negative urinalyses. UA at UC negative yesterday. Symptoms have been ongoing since December. Denies vaginal itching or discharge. Denies fever, hematuria.   No LMP recorded. Patient has had a hysterectomy.    Review of Systems  Constitutional: Negative.   Genitourinary:  Positive for pelvic pain and vaginal pain (Vulvar discomfort). Negative for difficulty urinating, dyspareunia, dysuria, flank pain, frequency, genital sores, hematuria, vaginal bleeding and vaginal discharge.       Objective:    Physical Exam Constitutional:      Appearance: Normal appearance.     BP 130/80   Pulse 78   Wt 185 lb (83.9 kg)   SpO2 100%   BMI 30.79 kg/m  Wt Readings from Last 3 Encounters:  06/01/23 185 lb (83.9 kg)  11/23/22 183 lb (83 kg)  11/17/21 186 lb (84.4 kg)   UA negative     Assessment & Plan:   Problem List Items Addressed This Visit   None Visit Diagnoses     Atrophic vaginitis    -  Primary   Relevant Medications   estradiol (ESTRACE VAGINAL) 0.1 MG/GM vaginal cream   Urgency of urination       Relevant Orders   Urinalysis,Complete w/RFL Culture (Completed)      Plan: UA negative. Discussed possible atrophic vaginitis and associated symptoms. Will start estradiol cream. Initially will use nightly x 2 weeks, then every other night x 1 week, then twice weekly.      Olivia Mackie DNP, 7:51 PM 06/01/2023

## 2023-06-01 NOTE — Telephone Encounter (Signed)
RN returned call to patient. Patient states she has been having ongoing uti symptoms, but nothing ever shows on urinalysis. Patient states she knows she has a uti as she has the classic symptoms. Reviewed with T.Earlene Plater, NP. Advised would need OV for evaluation. Patient scheduled for 1545 today. Patient agreeable to date and time of appointment. Aware to arrive 15 minutes early.   Routing to provider and will close encounter.

## 2023-06-15 ENCOUNTER — Encounter: Payer: Self-pay | Admitting: Nurse Practitioner

## 2023-06-15 DIAGNOSIS — N952 Postmenopausal atrophic vaginitis: Secondary | ICD-10-CM

## 2023-06-15 MED ORDER — ESTRADIOL 0.1 MG/GM VA CREA: 1.0000 g | TOPICAL_CREAM | VAGINAL | 0 refills | Status: DC

## 2023-06-15 NOTE — Telephone Encounter (Signed)
Med refill request: estradiol vaginal cream  Last AEX: 11/23/22 -TW Next AEX: Not Scheduled  Last MMG (if hormonal med) 10/07/21 BiRads 1 neg -MyChart message to patient asking if she has updated her MMG Refill authorized: Please Advise?

## 2023-06-16 NOTE — Telephone Encounter (Signed)
Routing to provider. FYI.  

## 2023-06-19 ENCOUNTER — Other Ambulatory Visit: Payer: Self-pay | Admitting: Nurse Practitioner

## 2023-08-15 ENCOUNTER — Telehealth: Payer: Self-pay

## 2023-08-15 DIAGNOSIS — N952 Postmenopausal atrophic vaginitis: Secondary | ICD-10-CM

## 2023-08-15 MED ORDER — ESTRADIOL 0.1 MG/GM VA CREA: TOPICAL_CREAM | VAGINAL | 0 refills | Status: DC

## 2023-08-15 NOTE — Telephone Encounter (Signed)
Elaine Gray from [facility] calling to get confirmation on Sig of pt's estrogen cream sent by EB.  Ok to confirm with pharmacy that the sig is supposed to be:  Initially: 1gm intravaginally at bedtime x 2 weeks. Then q other night x 1 week. Then to return to routine, twice weekly?  TIA.

## 2023-08-15 NOTE — Telephone Encounter (Signed)
Instructions should now be 1 gm intravaginally twice weekly at bedtime. Thanks.

## 2023-08-15 NOTE — Telephone Encounter (Signed)
LDVM on pharmacy machine with sig for estrogen cream. Encounter closed.

## 2023-08-26 ENCOUNTER — Other Ambulatory Visit: Payer: Self-pay | Admitting: Nurse Practitioner

## 2023-08-26 DIAGNOSIS — N952 Postmenopausal atrophic vaginitis: Secondary | ICD-10-CM

## 2023-10-27 LAB — HM MAMMOGRAPHY

## 2023-11-12 ENCOUNTER — Other Ambulatory Visit: Payer: Self-pay | Admitting: Obstetrics and Gynecology

## 2023-11-12 ENCOUNTER — Other Ambulatory Visit: Payer: Self-pay | Admitting: Nurse Practitioner

## 2023-11-12 DIAGNOSIS — N952 Postmenopausal atrophic vaginitis: Secondary | ICD-10-CM

## 2023-11-12 DIAGNOSIS — Z79890 Hormone replacement therapy (postmenopausal): Secondary | ICD-10-CM

## 2023-11-13 NOTE — Telephone Encounter (Signed)
 Medication refill request: estradiol vaginal cream  Last AEX:  11/23/22 Next AEX: 12/12/23 Last MMG (if hormonal medication request): 10/07/21 Refill authorized: please advise

## 2023-11-13 NOTE — Telephone Encounter (Signed)
 Med refill request: Climara 0.05 mg/24hr patch  Last AEX: 11/17/21 Next AEX: 12/12/23 Last MMG (if hormonal med) 10/06/21 birads cat 1 neg  Refill authorized: Last rx 11/23/22 #12 with 3 refills. Please approve or deny as appropriate

## 2023-11-13 NOTE — Telephone Encounter (Signed)
 Opened in error

## 2023-11-28 ENCOUNTER — Ambulatory Visit (HOSPITAL_COMMUNITY)
Admission: RE | Admit: 2023-11-28 | Discharge: 2023-11-28 | Disposition: A | Discharge status: Home / Self Care | Source: Ambulatory Visit | Attending: Vascular Surgery | Admitting: Vascular Surgery

## 2023-11-28 ENCOUNTER — Other Ambulatory Visit (HOSPITAL_COMMUNITY): Payer: Self-pay | Admitting: Orthopedic Surgery

## 2023-11-28 DIAGNOSIS — M25461 Effusion, right knee: Secondary | ICD-10-CM

## 2023-11-28 DIAGNOSIS — M25561 Pain in right knee: Secondary | ICD-10-CM | POA: Diagnosis present

## 2023-12-12 ENCOUNTER — Encounter: Payer: 59 | Admitting: Nurse Practitioner

## 2023-12-12 NOTE — Progress Notes (Deleted)
   ZI SEK [birth year] [id number]   History:  68 y.o. G2P0020 presents for breast and pelvic exam. Postmenopausal - on ERT. Using vaginal estrogen for atrophic vaginitis with good management. S/P 1996 LAVH BSO for endometriosis. Normal pap and mammogram history. Hypothyroidism managed by PCP.   Gynecologic History No LMP recorded. Patient has had a hysterectomy.   Contraception/Family planning: status post hysterectomy Sexually active: Yes  Health Maintenance Last Pap: 07/20/2017. Results were: Normal Last mammogram: 10/07/2021. Results were: Normal Last colonoscopy: 05/2020. Results were: Normal, 5-year recall Last Dexa: 04/19/2022. Results were: Normal  Past medical history, past surgical history, family history and social history were all reviewed and documented in the EPIC chart. Married. Works in Community education officer, remote.   ROS:  A ROS was performed and pertinent positives and negatives are included.  Exam:  There were no vitals filed for this visit.   There is no height or weight on file to calculate BMI.  General appearance:  Normal Thyroid:  Symmetrical, normal in size, without palpable masses or nodularity. Respiratory  Auscultation:  Clear without wheezing or rhonchi Cardiovascular  Auscultation:  Regular rate, without rubs, murmurs or gallops  Edema/varicosities:  Not grossly evident Abdominal  Soft,nontender, without masses, guarding or rebound.  Liver/spleen:  No organomegaly noted  Hernia:  None appreciated  Skin  Inspection:  Grossly normal   Breasts: Examined lying and sitting.   Right: Without masses, retractions, discharge or axillary adenopathy.   Left: Without masses, retractions, discharge or axillary adenopathy. Genitourinary   Inguinal/mons:  Normal without inguinal adenopathy  External genitalia:  Normal appearing vulva with no masses, tenderness, or lesions  BUS/Urethra/Skene's glands:  Normal  Vagina:  Normal appearing with normal color and  discharge, no lesions  Cervix:  Absent  Uterus:  Absent  Adnexa/parametria:     Rt: Normal in size, without masses or tenderness.   Lt: Normal in size, without masses or tenderness.  Anus and perineum: Normal  Digital rectal exam: Normal sphincter tone without palpated masses or tenderness  Patient informed chaperone available to be present for breast and pelvic exam. Patient has requested no chaperone to be present. Patient has been advised what will be completed during breast and pelvic exam.   Assessment/Plan:  68 y.o. G2P0020 for annual exam.   Well female exam with routine gynecological exam - Education provided on SBEs, importance of preventative screenings, current guidelines, high calcium diet, regular exercise, and multivitamin daily.  Labs with PCP.   Postmenopausal hormone therapy - Plan: estradiol (CLIMARA - DOSED IN MG/24 HR) 0.05 mg/24hr patch weekly. She is aware of the risks for blood clots, heart attack, stroke, and breast cancer. She would like to continue. Refill x 1 year provided.   Postmenopausal - On ERT. S/P LAVH BSO  Screening for cervical cancer - Normal Pap history.  No longer screening per guidelines.   Screening for breast cancer - Normal mammogram history. Prefers screenings every 2 years. Normal breast exam today.  Screening for colon cancer - 2021 colonoscopy. Will repeat at GI's recommended interval.   Screening for osteoporosis - Normal bone density last year 2023.  No follow-ups on file.     Olivia Mackie [facility], 1:30 PM 12/12/2023

## 2023-12-20 ENCOUNTER — Ambulatory Visit: Payer: Medicare Other | Admitting: Cardiology

## 2023-12-20 NOTE — Progress Notes (Deleted)
 Cardiology CONSULT Note    Date:  12/20/2023   ID:  Elaine Gray, DOB Dec 05, 1955, MRN [id number]  PCP:  Patient, No Pcp Per  Cardiologist:  Armanda Magic, MD   No chief complaint on file.   Patient Profile: Elaine Gray is a 68 y.o. female who is being seen today for the evaluation of chest pain at the request of Elaine Coin, MD.  History of Present Illness:  Elaine Gray is a 68 y.o. female who is being seen today for the evaluation of chest pain at the request of Elaine Coin, MD.  This is a 68 year old female with history of ADHD, hypothyroidism, asthma and prior history of shortness of breath and chest pain evaluated by Dr. Eldridge Dace in 2018 at which time 2D echo showed normal LV function EF 65 to 70% with G1 DD, mild AI and severe left atrial enlargement.  Past Medical History:  Diagnosis Date   Acquired hypothyroidism    ADD (attention deficit disorder)    ADHD    Allergy    mild respiratory allergies   Arthritis    in neck, thumbs, knee and eyes    Asthma    Basal cell carcinoma 2005,2006   Chest-Leg   Brain fog    Calcium oxalate stone in urine    Chronic constipation    Colon polyps    tubular adenomatous   Constipation    Diverticulosis    Endometriosis    Fatigue    Glaucoma    History of adenomatous polyp of colon    History of chest pain    Hypothyroidism    Kidney stone    Pulmonary nodule    Seasonal allergies    SOB (shortness of breath)    Sweating abnormality    Thyroid disease    hypothyroid    Past Surgical History:  Procedure Laterality Date   BASAL CELL CARCINOMA EXCISION  2005,2006   Chest-Leg   KNEE SURGERY     Arthroscopic   OOPHORECTOMY     PELVIC LAPAROSCOPY     Endometriosis   Plantarfaciotomy  2006   REPLACEMENT TOTAL KNEE     VAGINAL HYSTERECTOMY  1996   LAVH BSO    Current Medications: No outpatient medications have been marked as taking for the 12/20/23 encounter (Appointment) with Quintella Reichert, MD.    Allergies:   Codeine, Amoxicillin-pot clavulanate, Other, and Sulfonamide derivatives   Social History   Socioeconomic History   Marital status: Married    Spouse name: Not on file   Number of children: Not on file   Years of education: Not on file   Highest education level: Not on file  Occupational History   Not on file  Tobacco Use   Smoking status: Former   Smokeless tobacco: Never  Vaping Use   Vaping status: Never Used  Substance and Sexual Activity   Alcohol use: Yes    Alcohol/week: 0.0 standard drinks of alcohol    Comment: social-Rare   Drug use: No   Sexual activity: Yes    Birth control/protection: Surgical    Comment: 1st intercourse 68 yo-More than 5 partners  Other Topics Concern   Not on file  Social History Narrative   Married   Biomedical engineer   First menstrual cycle: 11 yrs   Pescatarian diet: 41 yrs    Moderate exercise: walking, yoga, light weights, biking, yard work    Social Drivers of [employer]  Resource Strain: Not on file  Food Insecurity: Not on file  Transportation Needs: Not on file  Physical Activity: Not on file  Stress: Not on file  Social Connections: Not on file     Family History:  The patient's family history includes Cancer in her maternal uncle; Colon polyps in her mother; Dementia in her mother; Diabetes in her maternal grandfather and mother; Glaucoma in her maternal grandmother; Heart disease in her father, mother, paternal grandfather, and paternal grandmother; Hypertension in her father, maternal grandfather, maternal grandmother, mother, paternal grandfather, paternal grandmother, and sister; Thyroid disease in her mother and sister; Uterine cancer in her mother.   ROS:   Please see the history of present illness.    ROS All other systems reviewed and are negative.      No data to display             PHYSICAL EXAM:   VS:  There were no vitals taken for this visit.   GEN: Well  nourished, well developed, in no acute distress  HEENT: normal  Neck: no JVD, carotid bruits, or masses Cardiac: RRR; no murmurs, rubs, or gallops,no edema.  Intact distal pulses bilaterally.  Respiratory:  clear to auscultation bilaterally, normal work of breathing GI: soft, nontender, nondistended, + BS MS: no deformity or atrophy  Skin: warm and dry, no rash Neuro:  Alert and Oriented x 3, Strength and sensation are intact Psych: euthymic mood, full affect  Wt Readings from Last 3 Encounters:  06/01/23 185 lb (83.9 kg)  11/23/22 183 lb (83 kg)  11/17/21 186 lb (84.4 kg)      Studies/Labs Reviewed:       Cardiac Studies & Procedures   ______________________________________________________________________________________________     ECHOCARDIOGRAM  ECHOCARDIOGRAM COMPLETE 10/18/2016  Narrative *[place]* *[facility]* [address] [place], [place] [address] [phone]  ------------------------------------------------------------------- Transthoracic Echocardiography  Patient:    Elaine Gray MR #:       [id number] Study Date: 10/18/2016 Gender:     F Age:        36 Height: Weight: BSA: Pt. Status: Room:  ATTENDING    Everette Rank, MD SONOGRAPHER  Delcie Roch, RDCS, CCT ORDERING     [place], [place] S REFERRING    [place], [place] S PERFORMING   [facility], Outpatient  cc:  ------------------------------------------------------------------- LV EF: 65% -   70%  ------------------------------------------------------------------- Indications:      Dyspnea 786.09.  ------------------------------------------------------------------- History:   Risk factors:  Edema.  ------------------------------------------------------------------- Study Conclusions  - Left ventricle: The cavity size was normal. Systolic function was vigorous. The estimated ejection fraction was in the range of 65% to 70%. Wall motion was normal;  there were no regional wall motion abnormalities. Doppler parameters are consistent with abnormal left ventricular relaxation (grade 1 diastolic dysfunction). Doppler parameters are consistent with indeterminate ventricular filling pressure. - Aortic valve: Transvalvular velocity was within the normal range. There was no stenosis. There was mild regurgitation. - Mitral valve: Transvalvular velocity was within the normal range. There was no evidence for stenosis. - Left atrium: The atrium was severely dilated. - Right ventricle: The cavity size was normal. Wall thickness was normal. Systolic function was normal. - Tricuspid valve: There was trivial regurgitation. - Pulmonary arteries: Systolic pressure was within the normal range. PA peak pressure: 20 mm Hg (S).  ------------------------------------------------------------------- Study data:  No prior study was available for comparison.  Study status:  Routine.  Procedure:  The patient reported no pain pre  or post test. Transthoracic echocardiography. Image quality was good. Study completion:  There were no complications. Transthoracic echocardiography.  M-mode, complete 2D, spectral Doppler, and color Doppler.  Birthdate:  Patient birthdate: Oct 07, 1955.  Age:  Patient is 68 yr old.  Sex:  Gender: female. Blood pressure:     147/88  Patient status:  Outpatient.  Study date:  Study date: 10/18/2016. Study time: 04:54 PM.  Location: Echo laboratory.  -------------------------------------------------------------------  ------------------------------------------------------------------- Left ventricle:  The cavity size was normal. Systolic function was vigorous. The estimated ejection fraction was in the range of 65% to 70%. Wall motion was normal; there were no regional wall motion abnormalities. Doppler parameters are consistent with abnormal left ventricular relaxation (grade 1 diastolic dysfunction). Doppler parameters are  consistent with indeterminate ventricular filling pressure.  ------------------------------------------------------------------- Aortic valve:  Poorly visualized. Mobility was not restricted. Doppler:  Transvalvular velocity was within the normal range. There was no stenosis. There was mild regurgitation.  ------------------------------------------------------------------- Aorta:  Aortic root: The aortic root was normal in size.  ------------------------------------------------------------------- Mitral valve:   Structurally normal valve.   Mobility was not restricted.  Doppler:  Transvalvular velocity was within the normal range. There was no evidence for stenosis. There was trivial regurgitation.    Peak gradient (D): 3 mm Hg.  ------------------------------------------------------------------- Left atrium:  The atrium was severely dilated.  ------------------------------------------------------------------- Right ventricle:  The cavity size was normal. Wall thickness was normal. Systolic function was normal.  ------------------------------------------------------------------- Pulmonic valve:    Structurally normal valve.   Cusp separation was normal.  Doppler:  Transvalvular velocity was within the normal range. There was no evidence for stenosis. There was no regurgitation.  ------------------------------------------------------------------- Tricuspid valve:   Structurally normal valve.    Doppler: Transvalvular velocity was within the normal range. There was trivial regurgitation.  ------------------------------------------------------------------- Pulmonary artery:   The main pulmonary artery was normal-sized. Systolic pressure was within the normal range.  ------------------------------------------------------------------- Right atrium:  The atrium was normal in size.  ------------------------------------------------------------------- Pericardium:  There was no  pericardial effusion.  ------------------------------------------------------------------- Systemic veins: Inferior vena cava: The vessel was normal in size. The respirophasic diameter changes were in the normal range (>= 50%), consistent with normal central venous pressure.  ------------------------------------------------------------------- Measurements  Left ventricle                             Value        Reference LV ID, ED, PLAX chordal                    43.6   mm    43 - 52 LV ID, ES, PLAX chordal                    27     mm    23 - 38 LV fx shortening, PLAX chordal             38     %     >=29 LV PW thickness, ED                        9.27   mm    ---------- IVS/LV PW ratio, ED                        0.98         <=1.3 Stroke volume, 2D  71     ml    ---------- LV e&', lateral                             7.94   cm/s  ---------- LV E/e&', lateral                           10.82        ---------- LV e&', medial                              7.07   cm/s  ---------- LV E/e&', medial                            12.15        ---------- LV e&', average                             7.51   cm/s  ---------- LV E/e&', average                           11.45        ----------  Ventricular septum                         Value        Reference IVS thickness, ED                          9.11   mm    ----------  LVOT                                       Value        Reference LVOT ID, S                                 20     mm    ---------- LVOT area                                  3.14   cm^2  ---------- LVOT peak velocity, S                      131    cm/s  ---------- LVOT mean velocity, S                      80.7   cm/s  ---------- LVOT VTI, S                                22.5   cm    ---------- LVOT peak gradient, S                      7      mm Hg ----------  Aorta  Value        Reference Aortic root ID, ED                          27     mm    ----------  Left atrium                                Value        Reference LA ID, A-P, ES                             31     mm    ---------- LA volume, S                               44     ml    ---------- LA volume, ES, 1-p A4C                     40     ml    ---------- LA volume, ES, 1-p A2C                     48.3   ml    ----------  Mitral valve                               Value        Reference Mitral E-wave peak velocity                85.9   cm/s  ---------- Mitral A-wave peak velocity                117    cm/s  ---------- Mitral deceleration time           (L)     132    ms    150 - 230 Mitral peak gradient, D                    3      mm Hg ---------- Mitral E/A ratio, peak                     0.7          ----------  Pulmonary arteries                         Value        Reference PA pressure, S, DP                         20     mm Hg <=30  Tricuspid valve                            Value        Reference Tricuspid regurg peak velocity             204.96 cm/s  ---------- Tricuspid peak RV-RA gradient              17     mm Hg ---------- Tricuspid maximal regurg velocity,  204.96 cm/s  ---------- PISA  Right atrium                               Value        Reference RA ID, S-I, ES, A4C                        43.2   mm    34 - 49 RA area, ES, A4C                           11.8   cm^2  8.3 - 19.5 RA volume, ES, A/L                         27     ml    ----------  Systemic veins                             Value        Reference Estimated CVP                              3      mm Hg ----------  Right ventricle                            Value        Reference TAPSE                                      26.1   mm    ---------- RV pressure, S, DP                         20     mm Hg <=30 RV s&', lateral, S                          12.4   cm/s  ----------  Legend: (L)  and  (H)  mark values outside specified reference  range.  ------------------------------------------------------------------- Prepared and Electronically Authenticated by  Chilton Si, MD 2018-01-30T18:20:50          ______________________________________________________________________________________________      Recent Labs: No results found for requested labs within last 365 days.   Lipid Panel    Component Value Date/Time   CHOL 232 (H) 07/13/2018 1019   TRIG 116 07/13/2018 1019   HDL 73 07/13/2018 1019   CHOLHDL 3.2 07/13/2018 1019   VLDL 15 06/13/2014 0930   LDLCALC 137 (H) 07/13/2018 1019      ASSESSMENT:    No diagnosis found.   PLAN:  In order of problems listed above:  ***  Time Spent: *** minutes total time of encounter, including *** minutes spent in face-to-face patient care on the date of this encounter. This time includes coordination of care and counseling regarding above mentioned problem list. Remainder of non-face-to-face time involved reviewing chart documents/testing relevant to the patient encounter and documentation in the medical record. I have independently reviewed documentation from referring provider  Followup:  ***  Medication Adjustments/Labs and Tests Ordered: Current medicines are reviewed  at length with the patient today.  Concerns regarding medicines are outlined above.  Medication changes, Labs and Tests ordered today are listed in the Patient Instructions below.  There are no Patient Instructions on file for this visit.   Signed, Armanda Magic, MD  12/20/2023 12:53 PM    [facility] [address] [place], [place], [place]  [address] Phone: [phone]; Fax: [phone]

## 2024-01-01 ENCOUNTER — Encounter: Payer: Self-pay | Admitting: Cardiology

## 2024-01-01 ENCOUNTER — Ambulatory Visit: Attending: Cardiology | Admitting: Cardiology

## 2024-01-01 VITALS — BP 140/84 | HR 88 | Ht 66.5 in | Wt 186.4 lb

## 2024-01-01 DIAGNOSIS — R079 Chest pain, unspecified: Secondary | ICD-10-CM | POA: Insufficient documentation

## 2024-01-01 DIAGNOSIS — R600 Localized edema: Secondary | ICD-10-CM | POA: Diagnosis not present

## 2024-01-01 DIAGNOSIS — R0602 Shortness of breath: Secondary | ICD-10-CM | POA: Insufficient documentation

## 2024-01-01 DIAGNOSIS — R072 Precordial pain: Secondary | ICD-10-CM | POA: Diagnosis present

## 2024-01-01 DIAGNOSIS — I700 Atherosclerosis of aorta: Secondary | ICD-10-CM | POA: Insufficient documentation

## 2024-01-01 MED ORDER — METOPROLOL TARTRATE 100 MG PO TABS: 100.0000 mg | ORAL_TABLET | Freq: Once | ORAL | 0 refills | Status: DC

## 2024-01-01 MED ORDER — FUROSEMIDE 20 MG PO TABS: ORAL_TABLET | ORAL | 6 refills | Status: DC

## 2024-01-01 NOTE — Progress Notes (Signed)
 Cardiology CONSULT Note    Date:  01/01/2024   ID:  Elaine Gray, DOB November 08, 1955, MRN [id number]  PCP:  Patient, No Pcp Per  Cardiologist:  Armanda Magic, MD   Chief Complaint  Patient presents with   New Patient (Initial Visit)    Chest pain    Patient Profile: Elaine Gray is a 68 y.o. female who is being seen today for the evaluation of chest pain at the request of Elaine Coin, MD.  History of Present Illness:  Elaine Gray is a 68 y.o. female who is being seen today for the evaluation of chest pain at the request of Elaine Coin, MD.  This is a 68 year old female with a history of ADHD, hypothyroidism, arthritis, asthma who was referred for evaluation of shortness of breath and chest pain.  She was seen by Dr.Varanasi in 2018 for preoperative cardiac clearance for knee surgery.  She also complained of lower extremity edema from time to time.  Was felt that some of her edema was coming from her knee inflammation and related to NSAID use.  2D echo 10/08/2016 showed EF 65 to 70% with G1 DD, mild AR, severe LAE.  Patient is now referred for cardiology consult due to chest pain and shortness of breath.  She tells me that occasionally she has some chest pain.  She has known costochondritis at the area of the lower left breast but recently has been having pain under the right breast a year ago but has been very sporadic since then. She had also been having pain in her right upper arm for a while but that has resolved. She has not had any further CP since last fall.  The pain was sharp with no radiation and no associated sx of nausea or diaphoresis. She does have problems with SOB which she has had since she had long COVID and developed asthma.  She is sedentary due to her work and leg problems.  She also has had problems with LE edema.  She has smoked remotely and heart disease in her family.    Past Medical History:  Diagnosis Date   Acquired hypothyroidism    ADD  (attention deficit disorder)    ADHD    Allergy    mild respiratory allergies   Arthritis    in neck, thumbs, knee and eyes    Asthma    Basal cell carcinoma 2005,2006   Chest-Leg   Brain fog    Calcium oxalate stone in urine    Chronic constipation    Colon polyps    tubular adenomatous   Constipation    Diverticulosis    Endometriosis    Fatigue    Glaucoma    History of adenomatous polyp of colon    History of chest pain    Hypothyroidism    Kidney stone    Pulmonary nodule    Seasonal allergies    SOB (shortness of breath)    Sweating abnormality    Thyroid disease    hypothyroid    Past Surgical History:  Procedure Laterality Date   BASAL CELL CARCINOMA EXCISION  2005,2006   Chest-Leg   KNEE SURGERY     Arthroscopic   OOPHORECTOMY     PELVIC LAPAROSCOPY     Endometriosis   Plantarfaciotomy  2006   REPLACEMENT TOTAL KNEE     VAGINAL HYSTERECTOMY  1996   LAVH BSO    Current Medications: No outpatient medications have been marked as  taking for the 01/01/24 encounter (Office Visit) with Jacqueline Matsu, MD.    Allergies:   Codeine, Amoxicillin-pot clavulanate, Other, and Sulfonamide derivatives   Social History   Socioeconomic History   Marital status: Married    Spouse name: Not on file   Number of children: Not on file   Years of education: Not on file   Highest education level: Not on file  Occupational History   Not on file  Tobacco Use   Smoking status: Former   Smokeless tobacco: Never  Vaping Use   Vaping status: Never Used  Substance and Sexual Activity   Alcohol use: Yes    Alcohol/week: 0.0 standard drinks of alcohol    Comment: social-Rare   Drug use: No   Sexual activity: Yes    Birth control/protection: Surgical    Comment: 1st intercourse 68 yo-More than 5 partners  Other Topics Concern   Not on file  Social History Narrative   Married   Biomedical engineer   First menstrual cycle: 11 yrs   Pescatarian diet: 41 yrs     Moderate exercise: walking, yoga, light weights, biking, yard work    Social Drivers of Corporate investment banker Strain: Not on [employer] broker Insecurity: Not on file  Transportation Needs: Not on file  Physical Activity: Not on file  Stress: Not on file  Social Connections: Not on file     Family History:  The patient's family history includes Cancer in her maternal uncle; Colon polyps in her mother; Dementia in her mother; Diabetes in her maternal grandfather and mother; Glaucoma in her maternal grandmother; Heart disease in her father, mother, paternal grandfather, and paternal grandmother; Hypertension in her father, maternal grandfather, maternal grandmother, mother, paternal grandfather, paternal grandmother, and sister; Thyroid disease in her mother and sister; Uterine cancer in her mother.   ROS:   Please see the history of present illness.    ROS All other systems reviewed and are negative.      No data to display             PHYSICAL EXAM:   VS:  There were no vitals taken for this visit.   GEN: Well nourished, well developed, in no acute distress  HEENT: normal  Neck: no JVD, carotid bruits, or masses Cardiac: RRR; no murmurs, rubs, or gallops.  1+ LE edema bilaterally with chronic venous stasis changes.  Intact distal pulses bilaterally.  Respiratory:  clear to auscultation bilaterally, normal work of breathing GI: soft, nontender, nondistended, + BS MS: no deformity or atrophy  Skin: warm and dry, no rash Neuro:  Alert and Oriented x 3, Strength and sensation are intact Psych: euthymic mood, full affect  Wt Readings from Last 3 Encounters:  06/01/23 185 lb (83.9 kg)  11/23/22 183 lb (83 kg)  11/17/21 186 lb (84.4 kg)      Studies/Labs Reviewed:      EKG Interpretation Date/Time:  Monday January 01 2024 14:55:25 EDT Ventricular Rate:  88 PR Interval:  150 QRS Duration:  74 QT Interval:  370 QTC Calculation: 447 R Axis:   41  Text  Interpretation: Normal sinus rhythm Normal ECG When compared with ECG of 16-Oct-2017 14:07, No significant change was found Confirmed by Gaylyn Keas ([id number]) on 01/01/2024 3:01:48 PM      Recent Labs: No results found for requested labs within last 365 days.   Lipid Panel    Component Value Date/Time   CHOL 232 (H) 07/13/2018 1019  TRIG 116 07/13/2018 1019   HDL 73 07/13/2018 1019   CHOLHDL 3.2 07/13/2018 1019   VLDL 15 06/13/2014 0930   LDLCALC 137 (H) 07/13/2018 1019      ASSESSMENT:    1. Chest pain of uncertain etiology   2. Shortness of breath      PLAN:  In order of problems listed above:  #Chest pain #Aortic Atherosclerosis - EKG is nonischemic - Cardiac risk factors include post menopause state and family history of heart disease - Chest CT in 2022 did not mention any Coronary artery calcifications - Check 2D echo to reassess LV function - Recommend coronary CTA to define coronary anatomy and rule out plaque  #Shortness of breath - Unclear etiology but she has had this before - She is a former smoker so this could be pulmonary related as well is cardiac - Check 2D echo to assess LV function - Get PFTs with DLCO to assess for COPD  # LE edema -likely related to chronic venous insufficiency and sedentary lifestyle -Add Lasix 20mg  daily x 3 days and then daily PRN for swelling -encouraged her to followup with <2gm Na diet -BMET in 1 week  Time Spent: 20 minutes total time of encounter, including 15 minutes spent in face-to-face patient care on the date of this encounter. This time includes coordination of care and counseling regarding above mentioned problem list. Remainder of non-face-to-face time involved reviewing chart documents/testing relevant to the patient encounter and documentation in the medical record. I have independently reviewed documentation from referring provider  Followup:  extender 4 weeks to check LE edema.  PRN with me pending results of  studies  Medication Adjustments/Labs and Tests Ordered: Current medicines are reviewed at length with the patient today.  Concerns regarding medicines are outlined above.  Medication changes, Labs and Tests ordered today are listed in the Patient Instructions below.  There are no Patient Instructions on file for this visit.   Signed, Gaylyn Keas, MD  01/01/2024 2:40 PM    [facility] [address] [place], [place], [place]  [address] Phone: [phone]; Fax: [phone]

## 2024-01-01 NOTE — Addendum Note (Signed)
 Addended by: Chrishauna Mee L on: 01/01/2024 03:22 PM   Modules accepted: Orders

## 2024-01-01 NOTE — Addendum Note (Signed)
 Addended by: Ahmari Garton on: 01/01/2024 03:37 PM   Modules accepted: Orders

## 2024-01-01 NOTE — Patient Instructions (Addendum)
 Medication Instructions:  Your physician has recommended you make the following change in your medication: Start lasix 20 mg by mouth daily for 3 days and then change to daily as needed for swelling  *If you need a refill on your cardiac medications before your next appointment, please call your pharmacy*  Lab Work: Have lab work (BMP) checked in one week.  This is not fasting.  Can be done at any [facility] locations.  Testing/Procedures: Your physician has requested that you have an echocardiogram. Echocardiography is a painless test that uses sound waves to create images of your heart. It provides your doctor with information about the size and shape of your heart and how well your heart's chambers and valves are working. This procedure takes approximately one hour. There are no restrictions for this procedure. Please do NOT wear cologne, perfume, aftershave, or lotions (deodorant is allowed). Please arrive 15 minutes prior to your appointment time.  Please note: We ask at that you not bring children with you during ultrasound (echo/ vascular) testing. Due to room size and safety concerns, children are not allowed in the ultrasound rooms during exams. Our front office staff cannot provide observation of children in our lobby area while testing is being conducted. An adult accompanying a patient to their appointment will only be allowed in the ultrasound room at the discretion of the ultrasound technician under special circumstances. We apologize for any inconvenience.  Your physician has recommended that you have a pulmonary function test. Pulmonary Function Tests are a group of tests that measure how well air moves in and out of your lungs.  Your physician has requested that you have cardiac CT. Cardiac computed tomography (CT) is a painless test that uses an x-ray machine to take clear, detailed pictures of your heart. For further information please visit [email or web address] Please follow  instruction sheet as given.   Follow-Up: At [facility], you and your health needs are our priority.  As part of our continuing mission to provide you with exceptional heart care, our providers are all part of one team.  This team includes your primary Cardiologist (physician) and Advanced Practice Providers or APPs (Physician Assistants and Nurse Practitioners) who all work together to provide you with the care you need, when you need it.  Your next appointment:   4 week(s)  Provider:   Lovette Rud, PA-C, Dayna Dunn, PA-C, Charles Connor, NP, Theotis Flake, PA-C, Slater Duncan, NP, Marlyse Single, PA-C, or Leala Prince, PA-C     Then, Gaylyn Keas, MD will plan to see you again as needed.     Other Instructions   Your cardiac CT will be scheduled at one of the below locations:   [facility] [address] [place], [place] [address] [phone]  OR   [facility] [address]  [place], [place] [address] Opening January 15, 2024  If scheduled at [facility], please arrive at the [facility] and Children's Entrance (Entrance C2) of [facility] 30 minutes prior to test start time. You can use the FREE valet parking offered at entrance C (encouraged to control the heart rate for the test)  Proceed to the [facility] Radiology Department (first floor) to check-in and test prep.  All radiology patients and guests should use entrance C2 at [facility], accessed from [facility], even though the hospital's physical address listed is [address]     Please follow these instructions  carefully (unless otherwise directed):  An IV will be required for this test and Nitroglycerin will be given.    On the Night Before the Test: Be sure to Drink plenty of water. Do not consume any caffeinated/decaffeinated beverages or chocolate 12 hours prior to your test. Do not take any antihistamines 12  hours prior to your test. (Loratadine)  On the Day of the Test: Drink plenty of water until 1 hour prior to the test. Do not eat any food 1 hour prior to test. You may take your regular medications prior to the test.  Take metoprolol (Lopressor) two hours prior to test. Patients who wear a continuous glucose monitor MUST remove the device prior to scanning. FEMALES- please wear underwire-free bra if available, avoid dresses & tight clothing c     After the Test: Drink plenty of water. After receiving IV contrast, you may experience a mild flushed feeling. This is normal. On occasion, you may experience a mild rash up to 24 hours after the test. This is not dangerous. If this occurs, you can take Benadryl 25 mg, Zyrtec, Claritin, or Allegra and increase your fluid intake. (Patients taking Tikosyn should avoid Benadryl, and may take Zyrtec, Claritin, or Allegra) If you experience trouble breathing, this can be serious. If it is severe call 911 IMMEDIATELY. If it is mild, please call our office.  We will call to schedule your test 2-4 weeks out understanding that some insurance companies will need an authorization prior to the service being performed.   For more information and frequently asked questions, please visit our website : [email or web address]  For non-scheduling related questions, please contact the cardiac imaging nurse navigator should you have any questions/concerns: Cardiac Imaging Nurse Navigators Direct Office Dial: [phone]   For scheduling needs, including cancellations and rescheduling, please call [place], [phone].         1st Floor: - Lobby - Registration  - Pharmacy  - Lab - Cafe  2nd Floor: - PV Lab - Diagnostic Testing (echo, CT, nuclear med)  3rd Floor: - Vacant  4th Floor: - TCTS (cardiothoracic surgery) - AFib Clinic - Structural Heart Clinic - Vascular Surgery  - Vascular Ultrasound  5th Floor: - HeartCare Cardiology  (general and EP) - Clinical Pharmacy for coumadin, hypertension, lipid, weight-loss medications, and med management appointments    Valet parking services will be available as well.

## 2024-01-08 ENCOUNTER — Encounter: Payer: Self-pay | Admitting: Cardiology

## 2024-01-08 ENCOUNTER — Telehealth: Payer: Self-pay | Admitting: Cardiology

## 2024-01-08 NOTE — Telephone Encounter (Signed)
-----   Message from Gearhart E sent at 01/01/2024  3:53 PM EDT ----- Regarding: Appointment Patient need 4 week follow up with APP

## 2024-01-08 NOTE — Telephone Encounter (Signed)
 Patient contacted 3x with no success, will send letter.

## 2024-01-09 ENCOUNTER — Ambulatory Visit (HOSPITAL_COMMUNITY)
Admission: RE | Admit: 2024-01-09 | Discharge: 2024-01-09 | Disposition: A | Discharge status: Home / Self Care | Source: Ambulatory Visit | Attending: Cardiology | Admitting: Cardiology

## 2024-01-09 DIAGNOSIS — R0602 Shortness of breath: Secondary | ICD-10-CM | POA: Insufficient documentation

## 2024-01-09 LAB — PULMONARY FUNCTION TEST
DL/VA % pred: 84 %
DL/VA: 3.45 ml/min/mmHg/L
DLCO unc % pred: 82 %
DLCO unc: 17.03 ml/min/mmHg
FEF 25-75 Post: 2.29 L/s
FEF 25-75 Pre: 1.68 L/s
FEF2575-%Change-Post: 36 %
FEF2575-%Pred-Post: 112 %
FEF2575-%Pred-Pre: 82 %
FEV1-%Change-Post: 7 %
FEV1-%Pred-Post: 94 %
FEV1-%Pred-Pre: 87 %
FEV1-Post: 2.3 L
FEV1-Pre: 2.13 L
FEV1FVC-%Change-Post: 0 %
FEV1FVC-%Pred-Pre: 97 %
FEV6-%Change-Post: 8 %
FEV6-%Pred-Post: 100 %
FEV6-%Pred-Pre: 92 %
FEV6-Post: 3.09 L
FEV6-Pre: 2.86 L
FEV6FVC-%Change-Post: 0 %
FEV6FVC-%Pred-Post: 104 %
FEV6FVC-%Pred-Pre: 104 %
FVC-%Change-Post: 7 %
FVC-%Pred-Post: 95 %
FVC-%Pred-Pre: 88 %
FVC-Post: 3.09 L
FVC-Pre: 2.86 L
Post FEV1/FVC ratio: 75 %
Post FEV6/FVC ratio: 100 %
Pre FEV1/FVC ratio: 75 %
Pre FEV6/FVC Ratio: 100 %
RV % pred: 119 %
RV: 2.68 L
TLC % pred: 102 %
TLC: 5.42 L

## 2024-01-09 MED ORDER — ALBUTEROL SULFATE (2.5 MG/3ML) 0.083% IN NEBU
2.5000 mg | INHALATION_SOLUTION | Freq: Once | RESPIRATORY_TRACT | Status: AC
  Administered 2024-01-09: 2.5 mg via RESPIRATORY_TRACT

## 2024-01-11 ENCOUNTER — Encounter: Payer: Self-pay | Admitting: Cardiology

## 2024-01-11 DIAGNOSIS — R600 Localized edema: Secondary | ICD-10-CM

## 2024-01-11 LAB — BASIC METABOLIC PANEL WITH GFR
BUN/Creatinine Ratio: 16 (ref 12–28)
BUN: 13 mg/dL (ref 8–27)
CO2: 22 mmol/L (ref 20–29)
Calcium: 9.8 mg/dL (ref 8.7–10.3)
Chloride: 104 mmol/L (ref 96–106)
Creatinine, Ser: 0.79 mg/dL (ref 0.57–1.00)
Glucose: 94 mg/dL (ref 70–99)
Potassium: 5.1 mmol/L (ref 3.5–5.2)
Sodium: 138 mmol/L (ref 134–144)
eGFR: 81 mL/min/{1.73_m2} (ref >59)

## 2024-01-16 ENCOUNTER — Encounter: Payer: Self-pay | Admitting: Nurse Practitioner

## 2024-01-17 ENCOUNTER — Other Ambulatory Visit: Payer: Self-pay | Admitting: Nurse Practitioner

## 2024-01-17 DIAGNOSIS — Z79890 Hormone replacement therapy (postmenopausal): Secondary | ICD-10-CM

## 2024-01-17 MED ORDER — ESTRADIOL 0.0375 MG/24HR TD PTWK: 0.0375 mg | MEDICATED_PATCH | TRANSDERMAL | 0 refills | Status: DC

## 2024-01-18 DIAGNOSIS — I2510 Atherosclerotic heart disease of native coronary artery without angina pectoris: Secondary | ICD-10-CM | POA: Insufficient documentation

## 2024-01-18 HISTORY — DX: Atherosclerotic heart disease of native coronary artery without angina pectoris: I25.10

## 2024-01-29 ENCOUNTER — Encounter (HOSPITAL_COMMUNITY): Payer: Self-pay

## 2024-01-30 ENCOUNTER — Ambulatory Visit: Payer: Self-pay

## 2024-01-30 ENCOUNTER — Ambulatory Visit: Admitting: General Practice

## 2024-01-30 ENCOUNTER — Telehealth (HOSPITAL_COMMUNITY): Payer: Self-pay | Admitting: *Deleted

## 2024-01-30 NOTE — Telephone Encounter (Signed)
  Chief Complaint: right leg swelling  Symptoms: swelling   Disposition: [] ED /[] Urgent Care (no appt availability in office) / [x] Appointment(In office/virtual)/ []  [place] [facility]/ [] Home Care/ [] Refused Recommended Disposition /[] [facility]/ []  Follow-up with PCP Additional Notes: Pt calling with right leg swelling. Pt has been dealing with this for several months. Pt has been seeing ortho and vein specialist  and all tests have been normal. Pt is going to cardiologist tomorrow for heart scan. Pt stated there is no redness/warmth to foot. Pt has been eating low sodium diet and elevating when she can. Cause in unknown but pt is curious if related to cut to foot last summer/fall and became "septic." Pt is looking to set up new PCP. Pt requesting PCS. Due to her schedule pt requested appt on 5/23. RN gave care advice and pt verbalized understanding.                Copied from CRM [id number]. Topic: Clinical - Red Word Triage >> Jan 30, 2024  4:29 PM Danelle Dunning F wrote: Red Word that prompted transfer to Nurse Triage:   Right leg swollen; unknown cause  Swelling has been present for month; and a  torn meniscus has been treated Reason for Disposition  [1] MILD swelling of both ankles (i.e., pedal edema) AND [2] is a chronic symptom (recurrent or ongoing AND present > 4 weeks)  Answer Assessment - Initial Assessment Questions 1. ONSET: "When did the swelling start?" (e.g., minutes, hours, days)     2 months ago  2. LOCATION: "What part of the leg is swollen?"  "Are both legs swollen or just one leg?"     Foot and ankle  3. SEVERITY: "How bad is the swelling?" (e.g., localized; mild, moderate, severe)   - Localized: Small area of swelling localized to one leg.   - MILD pedal edema: Swelling limited to foot and ankle, pitting edema < 1/4 inch (6 mm) deep, rest and elevation eliminate most or all swelling.   - MODERATE edema: Swelling of lower leg to knee, pitting edema  > 1/4 inch (6 mm) deep, rest and elevation only partially reduce swelling.   - SEVERE edema: Swelling extends above knee, facial or hand swelling present.      Mild  4. REDNESS: "Does the swelling look red or infected?"     No redness 5. PAIN: "Is the swelling painful to touch?" If Yes, ask: "How painful is it?"   (Scale 1-10; mild, moderate or severe)     Denies  6. FEVER: "Do you have a fever?" If Yes, ask: "What is it, how was it measured, and when did it start?"      Denies  7. CAUSE: "What do you think is causing the leg swelling?"     Not sure  8. MEDICAL HISTORY: "Do you have a history of blood clots (e.g., DVT), cancer, heart failure, kidney disease, or liver failure?"     Denies  9. RECURRENT SYMPTOM: "Have you had leg swelling before?" If Yes, ask: "When was the last time?" "What happened that time?"     No  10. OTHER SYMPTOMS: "Do you have any other symptoms?" (e.g., chest pain, difficulty breathing)       Denies  Protocols used: Leg Swelling and Edema-A-AH

## 2024-01-30 NOTE — Telephone Encounter (Signed)
 Attempted to call patient regarding upcoming cardiac CT appointment. Left message on voicemail with name and callback number  Larey Brick RN Navigator Cardiac Imaging [facility] [phone] Office [phone] Cell

## 2024-01-31 ENCOUNTER — Encounter: Payer: Self-pay | Admitting: Cardiology

## 2024-01-31 ENCOUNTER — Ambulatory Visit (HOSPITAL_BASED_OUTPATIENT_CLINIC_OR_DEPARTMENT_OTHER)

## 2024-01-31 ENCOUNTER — Ambulatory Visit: Payer: Self-pay | Admitting: Cardiology

## 2024-01-31 ENCOUNTER — Ambulatory Visit (HOSPITAL_COMMUNITY)
Admission: RE | Admit: 2024-01-31 | Discharge: 2024-01-31 | Disposition: A | Discharge status: Home / Self Care | Source: Ambulatory Visit | Attending: Cardiology | Admitting: Cardiology

## 2024-01-31 DIAGNOSIS — I351 Nonrheumatic aortic (valve) insufficiency: Secondary | ICD-10-CM | POA: Diagnosis not present

## 2024-01-31 DIAGNOSIS — R072 Precordial pain: Secondary | ICD-10-CM | POA: Diagnosis not present

## 2024-01-31 DIAGNOSIS — I700 Atherosclerosis of aorta: Secondary | ICD-10-CM

## 2024-01-31 DIAGNOSIS — I361 Nonrheumatic tricuspid (valve) insufficiency: Secondary | ICD-10-CM | POA: Diagnosis not present

## 2024-01-31 DIAGNOSIS — I340 Nonrheumatic mitral (valve) insufficiency: Secondary | ICD-10-CM | POA: Insufficient documentation

## 2024-01-31 DIAGNOSIS — R0602 Shortness of breath: Secondary | ICD-10-CM

## 2024-01-31 DIAGNOSIS — R079 Chest pain, unspecified: Secondary | ICD-10-CM

## 2024-01-31 DIAGNOSIS — I2510 Atherosclerotic heart disease of native coronary artery without angina pectoris: Secondary | ICD-10-CM

## 2024-01-31 LAB — ECHOCARDIOGRAM COMPLETE
Area-P 1/2: 3.68 cm2
MV M vel: 4.95 m/s
MV Peak grad: 98 mmHg
P 1/2 time: 390 ms
Radius: 0.55 cm
S' Lateral: 2.4 cm

## 2024-01-31 MED ORDER — IOHEXOL 350 MG/ML SOLN
100.0000 mL | Freq: Once | INTRAVENOUS | Status: AC | PRN
  Administered 2024-01-31: 100 mL via INTRAVENOUS

## 2024-01-31 MED ORDER — NITROGLYCERIN 0.4 MG SL SUBL
0.8000 mg | SUBLINGUAL_TABLET | Freq: Once | SUBLINGUAL | Status: AC
Start: 2024-01-31 — End: 2024-01-31
  Administered 2024-01-31: 0.8 mg via SUBLINGUAL

## 2024-02-01 ENCOUNTER — Encounter: Payer: Self-pay | Admitting: Cardiology

## 2024-02-01 DIAGNOSIS — I700 Atherosclerosis of aorta: Secondary | ICD-10-CM | POA: Insufficient documentation

## 2024-02-01 NOTE — Telephone Encounter (Signed)
-----   Message from Gaylyn Keas sent at 02/01/2024  2:05 PM EDT ----- 1 year followup with me

## 2024-02-01 NOTE — Telephone Encounter (Signed)
 Call to patient to discuss coronary CTA results. No answer, left message with no recipient identifiers asking recipient to call [place] at our office #. Also sent [facility] message.

## 2024-02-02 NOTE — Progress Notes (Signed)
 Cardiology Clinic Note   Patient Name: Elaine Gray Date of Encounter: 02/05/2024  Primary Care Provider:  Patient, No Pcp Per Primary Cardiologist:  Gaylyn Keas, MD  Patient Profile    Elaine Gray 68 year old female presents to the clinic today for follow-up evaluation of her chest pain.  Past Medical History    Past Medical History:  Diagnosis Date   Acquired hypothyroidism    ADD (attention deficit disorder)    ADHD    Allergy    mild respiratory allergies   Aortic atherosclerosis (HCC)    Aortic insufficiency    mild to moderate by echo 01/2024   Arthritis    in neck, thumbs, knee and eyes    Asthma    Basal cell carcinoma 2005,2006   Chest-Leg   Brain fog    CAD (coronary artery disease), native coronary artery 01/2024   Coronary CTA showed Cor calcium score of 192 with 25-49% prox LAD   Calcium oxalate stone in urine    Chronic constipation    Colon polyps    tubular adenomatous   Constipation    Diverticulosis    Endometriosis    Fatigue    Glaucoma    History of adenomatous polyp of colon    History of chest pain    Hypothyroidism    Kidney stone    Mitral regurgitation    moderate by echo 01/2024   Pulmonary nodule    Seasonal allergies    SOB (shortness of breath)    Sweating abnormality    Thyroid  disease    hypothyroid   Past Surgical History:  Procedure Laterality Date   BASAL CELL CARCINOMA EXCISION  2005,2006   Chest-Leg   KNEE SURGERY     Arthroscopic   OOPHORECTOMY     PELVIC LAPAROSCOPY     Endometriosis   Plantarfaciotomy  2006   REPLACEMENT TOTAL KNEE     VAGINAL HYSTERECTOMY  1996   LAVH BSO    Allergies  Allergies  Allergen Reactions   Codeine Swelling   Amoxicillin-Pot Clavulanate Diarrhea and Other (See Comments)    Has patient had a PCN reaction causing immediate rash, facial/tongue/throat swelling, SOB or lightheadedness with hypotension: No Has patient had a PCN reaction causing severe rash  involving mucus membranes or skin necrosis: No Has patient had a PCN reaction that required hospitalization No Has patient had a PCN reaction occurring within the last 10 years: No If all of the above answers are "NO", then may proceed with Cephalosporin use.  Stomach pain   Other     Bell Pepper-swelling  Bee Stings-swelling   Sulfacetamide Sodium     Other Reaction(s): Unknown   Sulfonamide Derivatives     unknown    History of Present Illness    Elaine Gray has a PMH of fatigue, ADD, acquired hypothyroidism, shortness of breath, seasonal allergies, kidney stones, chest pain, aortic atherosclerosis, and lower extremity edema.  She was referred by her PCP for evaluation of her chest discomfort 01/01/2024.  She noted chest pain and shortness of breath.  She was previously seen by Dr.Varanasi for preoperative cardiac evaluation in 2018.  She was also noted to have lower extremity edema dementedly.  Echocardiogram 1/18 showed an LVEF of 65-70%, G1 DD, mild AR, severe LAE.  During her evaluation Dr. Micael Adas she noted some chest pain.  She reported no costochondritis in the area below her left breast.  She reported recently having pain under her right breast.  This was sporadic.  She also noted pain under her right upper arm.  This had resolved.  She denied further chest pain since last fall.  She described her pain as sharp with no radiation.  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  She had developed asthma.  She was sedentary due to her work and leg problems.  She had problems with lower extremity edema.  She had remotely smoked.  An echocardiogram and coronary CTA were ordered.  PFTs were also ordered.  Her PFTs were essentially normal.  Her echocardiogram showed an LVEF of 60-65%, G1 DD, and moderate mitral valve regurgitation.  Her coronary CTA showed a coronary calcium score of 192 placing her in the  83rd percentile for age sex and race matched  controls.  It was recommended that she come in for fasting lipid panel and ALT's.  It was recommended that she start 81 mg aspirin  and discontinue her estrogen patch due to increased risk for progression of coronary artery disease with estrogen replacement therapy.  She presents to the clinic today for follow-up evaluation and states she eats a Mediterranean style diet.  She is ready to be more physically active.  We reviewed her PFTs, coronary CTA and echocardiogram.  She expressed understanding.  I went over recommendations from Dr. Micael Adas to start 81 mg aspirin  and have lipids and LFTs drawn.  I will order these.  I will have her continue to work on increasing her physical activity and maintain her diet.  She reports that she does not want to start statin therapy.  She wishes to work on diet and exercise.  She notes that she has a recent right leg injury.  She has been receiving right knee injections.  She notices improvement in her lower extremity swelling with increased physical activity.  She does have some lower extremity support stockings which she plans on using.  I will plan follow-up in 9 to 12 months.  Today she denies chest pain, shortness of breath, lower extremity edema, fatigue, palpitations, melena, hematuria, hemoptysis, diaphoresis, weakness, presyncope, syncope, orthopnea, and PND.   Home Medications    Prior to Admission medications   Medication Sig Start Date End Date Taking? Authorizing Provider  Alpha-D-Galactosidase (ECK FOOD ENZYME PO) Take 1 capsule by mouth every evening. Food Enzyme Compound    [provider]  AMINO ACIDS PO Take 1 capsule by mouth daily.     [provider]  amphetamine-dextroamphetamine (ADDERALL XR) 25 MG 24 hr capsule Take 25 mg by mouth daily. 09/07/16   [provider]  B Complex Vitamins (VITAMIN B COMPLEX PO) Take 1 capsule by mouth daily.     [provider]  B Complex-Biotin-FA (SUPER QUINTS B-50) TABS Take by  mouth. 12/14/17   [provider]  Boswellia Serrata (BOSWELLIA PO) Take 1 capsule by mouth daily as needed (for arthritis pain).    [provider]  budesonide-formoterol [facility]) 160-4.5 MCG/ACT inhaler  10/11/23   [provider]  Cholecalciferol (VITAMIN D ) 2000 units CAPS Take 2,000 Units by mouth See admin instructions. (2-3x's weekly)    [provider]  Coenzyme Q10 (COQ10) 100 MG CAPS Take 100 mg by mouth daily.     [provider]  COMBIGAN 0.2-0.5 % ophthalmic solution Place 1 drop into the left eye 2 (two) times daily. 09/08/16   [provider]  CREATINE PO Take 250 mg by mouth See admin instructions. (2-3x's weekly)  [provider]  Cyanocobalamin (VITAMIN B-12 CR) 1000 MCG TBCR Take 1,000 mcg by mouth daily.    [provider]  dorzolamide-timolol (COSOPT) 22.3-6.8 MG/ML ophthalmic solution INSTILL 1 DROP INTO RIGHT EYE DAILY AND 1 DROP INTO LEFT EYE TWICE DAILY 05/18/21   [provider]  estradiol  (CLIMARA ) 0.0375 mg/24hr patch Place 1 patch (0.0375 mg total) onto the skin once a week. 01/17/24   Andee Bamberger, NP  furosemide  (LASIX ) 20 MG tablet Take one tablet by mouth daily for 3 days and then as needed for swelling 01/01/24   Turner, Rufus Council, MD  latanoprost (XALATAN) 0.005 % ophthalmic solution Place 1 drop into both eyes at bedtime.     [provider]  levothyroxine  (SYNTHROID ) 88 MCG tablet Take 88 mcg by mouth daily at 6 (six) AM. 01/19/21   [provider]  liothyronine (CYTOMEL) 5 MCG tablet Take 5 mcg by mouth daily. 01/19/21   [provider]  loratadine (CLARITIN) 10 MG tablet  12/02/23   [provider]  magnesium oxide (MAG-OX) 400 MG tablet Take 400 mg by mouth every evening.    [provider]  metoprolol  tartrate (LOPRESSOR ) 100 MG tablet Take 1 tablet (100 mg total) by mouth once for 1 dose. Take 90-120 minutes prior to scan. 01/01/24  01/01/24  Jacqueline Matsu, MD  Nutritional Supplements (DHEA PO) Take 100 mg by mouth daily.    [provider]  Omega 3-6-9 Fatty Acids (OMEGA-3-6-9 PO) Take 1 capsule by mouth See admin instructions. (3-4x's weekly)    [provider]  Probiotic Product (PROBIOTIC DAILY PO) Take 1 capsule by mouth every evening. Probiotic 50 billion Compound    [provider]  Quercetin 250 MG TABS Take 250 mg by mouth See admin instructions. (2-3x's weekly)    [provider]  sodium chloride  (OCEAN) 0.65 % SOLN nasal spray Place 1 spray into both nostrils at bedtime.    [provider]    Family History    Family History  Problem Relation Age of Onset   Diabetes Mother    Hypertension Mother    Heart disease Mother    Uterine cancer Mother    Dementia Mother    Thyroid  disease Mother    Colon polyps Mother    Hypertension Father    Heart disease Father    Diabetes Maternal Grandfather    Hypertension Maternal Grandfather    Heart disease Paternal Grandfather    Hypertension Paternal Grandfather    Glaucoma Maternal Grandmother    Hypertension Maternal Grandmother    Hypertension Sister    Thyroid  disease Sister    Cancer Maternal Uncle        Pancreatic   Hypertension Paternal Grandmother    Heart disease Paternal Grandmother    Colon cancer Neg Hx    She indicated that her mother is deceased. She indicated that her father is deceased. She indicated that the status of her sister is unknown. She indicated that the status of her maternal grandmother is unknown. She indicated that the status of her maternal grandfather is unknown. She indicated that the status of her paternal grandmother is unknown. She indicated that the status of her paternal grandfather is unknown. She indicated that the status of her maternal uncle is unknown. She indicated that the status of her neg hx is unknown.  Social History    Social History   Socioeconomic History    Marital status: Married    Spouse  name: Not on file   Number of children: Not on file   Years of education: Not on file   Highest education level: Not on file  Occupational History   Not on file  Tobacco Use   Smoking status: Former   Smokeless tobacco: Never  Vaping Use   Vaping status: Never Used  Substance and Sexual Activity   Alcohol use: Yes    Alcohol/week: 0.0 standard drinks of alcohol    Comment: social-Rare   Drug use: No   Sexual activity: Yes    Birth control/protection: Surgical    Comment: 1st intercourse 68 yo-More than 5 partners  Other Topics Concern   Not on file  Social History Narrative   Married   Biomedical engineer   First menstrual cycle: 11 yrs   Pescatarian diet: 41 yrs    Moderate exercise: walking, yoga, light weights, biking, yard work    Social Drivers of Corporate investment banker Strain: Not on Ship broker Insecurity: Not on file  Transportation Needs: Not on file  Physical Activity: Not on file  Stress: Not on file  Social Connections: Not on file  Intimate Partner Violence: Not on file     Review of Systems    General:  No chills, fever, night sweats or weight changes.  Cardiovascular:  No chest pain, dyspnea on exertion, edema, orthopnea, palpitations, paroxysmal nocturnal dyspnea. Dermatological: No rash, lesions/masses Respiratory: No cough, dyspnea Urologic: No hematuria, dysuria Abdominal:   No nausea, vomiting, diarrhea, bright red blood per rectum, melena, or hematemesis Neurologic:  No visual changes, wkns, changes in mental status. All other systems reviewed and are otherwise negative except as noted above.  Physical Exam    VS:  BP (!) 142/78 Comment: well controlled at home  Pulse 95   Ht 5' 6.5" (1.689 m)   Wt 187 lb (84.8 kg)   SpO2 95%   BMI 29.73 kg/m  , BMI Body mass index is 29.73 kg/m. GEN: Well nourished, well developed, in no acute distress. HEENT: normal. Neck: Supple, no JVD, carotid bruits, or  masses. Cardiac: RRR, no murmurs, rubs, or gallops. No clubbing, cyanosis, edema.  Radials/DP/PT 2+ and equal bilaterally.  Respiratory:  Respirations regular and unlabored, clear to auscultation bilaterally. GI: Soft, nontender, nondistended, BS + x 4. MS: no deformity or atrophy. Skin: warm and dry, no rash. Neuro:  Strength and sensation are intact. Psych: Normal affect.  Accessory Clinical Findings    Recent Labs: 01/10/2024: BUN 13; Creatinine, Ser 0.79; Potassium 5.1; Sodium 138   Recent Lipid Panel    Component Value Date/Time   CHOL 232 (H) 07/13/2018 1019   TRIG 116 07/13/2018 1019   HDL 73 07/13/2018 1019   CHOLHDL 3.2 07/13/2018 1019   VLDL 15 06/13/2014 0930   LDLCALC 137 (H) 07/13/2018 1019    HYPERTENSION CONTROL Vitals:   02/05/24 1508 02/05/24 1538  BP: (!) 150/84 (!) 142/78    The patient's blood pressure is elevated above target today.  In order to address the patient's elevated BP: Blood pressure will be monitored at home to determine if medication changes need to be made. (Well-controlled at home in the 120 systolic)       ECG personally reviewed by me today-none today.    Echocardiogram 01/31/2024  IMPRESSIONS   1. Left ventricular ejection fraction, by estimation, is 60 to 65%. Left ventricular ejection fraction by 3D volume is 63 %. The left ventricle has normal function. The left ventricle has  no regional wall motion abnormalities. Left ventricular diastolic parameters are consistent with Grade I diastolic dysfunction (impaired relaxation). The average left ventricular global longitudinal strain is -22.4 %. The global longitudinal strain is normal. 2. Right ventricular systolic function is normal. The right ventricular size is normal. There is normal pulmonary artery systolic pressure. The estimated right ventricular systolic pressure is 25.1 mmHg. 3. PISA radius 0.6 cm. The mitral valve is normal in structure. Moderate mitral valve regurgitation.  No evidence of mitral stenosis. 4. The aortic valve is calcified. Aortic valve regurgitation is mild to moderate. No aortic stenosis is present. 5. The inferior vena cava is normal in size with greater than 50% respiratory variability, suggesting right atrial pressure of 3 mmHg.  FINDINGS Left Ventricle: Left ventricular ejection fraction, by estimation, is 60 to 65%. Left ventricular ejection fraction by 3D volume is 63 %. The left ventricle has normal function. The left ventricle has no regional wall motion abnormalities. The average  left ventricular global longitudinal strain is -22.4 %. Strain was performed and the global longitudinal strain is normal. The left ventricular internal cavity size was normal in size. There is no left ventricular hypertrophy. Left ventricular diastolic  parameters are consistent with Grade I diastolic dysfunction (impaired relaxation).  Right Ventricle: The right ventricular size is normal. No increase in right ventricular wall thickness. Right ventricular systolic function is normal. There is normal pulmonary artery systolic pressure. The tricuspid regurgitant velocity is 2.35 m/s, and with an assumed right atrial pressure of 3 mmHg, the estimated right ventricular systolic pressure is 25.1 mmHg.  Left Atrium: Left atrial size was normal in size.  Right Atrium: Right atrial size was normal in size.  Pericardium: There is no evidence of pericardial effusion.  Mitral Valve: PISA radius 0.6 cm. The mitral valve is normal in structure. Moderate mitral valve regurgitation. No evidence of mitral valve stenosis.  Tricuspid Valve: The tricuspid valve is normal in structure. Tricuspid valve regurgitation is mild . No evidence of tricuspid stenosis.  Aortic Valve: The aortic valve is calcified. Aortic valve regurgitation is mild to moderate. Aortic regurgitation PHT measures 390 msec. No aortic stenosis is present.  Pulmonic Valve: The pulmonic valve was normal in  structure. Pulmonic valve regurgitation is not visualized. No evidence of pulmonic stenosis.  Aorta: The aortic root is normal in size and structure.  Venous: The inferior vena cava is normal in size with greater than 50% respiratory variability, suggesting right atrial pressure of 3 mmHg.  IAS/Shunts: No atrial level shunt detected by color flow Doppler.   Coronary CTA 01/31/2024  FINDINGS: Coronary Arteries: Normal coronary origin. Right dominance.  Coronary Calcium Score:  Left main: 0  Left anterior descending artery: 192  Left circumflex artery: 0  Right coronary artery: 0  Total: 192  Percentile: 83rd for age, sex, and race matched control.  Left main: The left main is a large caliber vessel with a normal take off from the left coronary cusp that bifurcates to form a left anterior descending artery and a left circumflex artery. There is no significant plaque.  Left anterior descending artery: The LAD is a large caliber vessel with two diagonal vessels. Three mild calcified plaques (25-49%) in the proximal LAD.  Left circumflex artery: The LCX is non-dominant with two OM vessels. There is no significant plaque.  Right coronary artery: The RCA is dominant with normal take off from the right coronary cusp. the RCA terminates as a PDA and right posterolateral branch. There is no  significant plaque.  Other non-coronary findings:  Right Atrium: Right atrial size is dilated.  Right Ventricle: The right ventricular cavity is within normal limits.  Left Atrium: Left atrial size is normal in size with no left atrial appendage filling defect.  Left Ventricle: The ventricular cavity size is within normal limits.  Interatrial septum: No PFO or ASD.  Main Pulmonary Artery: Moderate dilation 32 mm.  Pulmonary veins: Normal pulmonary venous drainage.  Pericardium: Normal thickness without significant effusion or calcium present.  Cardiac valves: The aortic valve  is trileaflet without significant calcification. The mitral valve is normal without significant calcification.  Aorta: Normal caliber with aortic atherosclerosis.  Extra-cardiac findings: See attached radiology report for non-cardiac structures.  Artifact: Motion artifact  Image quality: Fair  IMPRESSION: 1. Coronary calcium score of 192. This was 83rd percentile for age, sex, and race matched control.  2. Normal coronary origin with right dominance.  3. CAD-RADS 2. Mild non-obstructive CAD (25-49%). Consider non-atherosclerotic causes of chest pain. Consider preventive therapy and risk factor modification.  4. Aortic atherosclerosis.  RECOMMENDATIONS: RECOMMENDATIONS The proposed cut-off value of 1,651 AU yielded a 93 % sensitivity and 75 % specificity in grading AS severity in patients with classical low-flow, low-gradient AS. Proposed different cut-off values to define severe AS for men and women as 2,065 AU and 1,274 AU, respectively. The joint European and American recommendations for the assessment of AS consider the aortic valve calcium score as a continuum - a very high calcium score suggests severe AS and a low calcium score suggests severe AS is unlikely.  Florette Hurry, et al. 2017 ESC/EACTS Guidelines for the management of valvular heart disease. Eur Heart J 2017;38:2739-91.  Coronary artery calcium (CAC) score is a strong predictor of incident coronary heart disease (CHD) and provides predictive information beyond traditional risk factors. CAC scoring is reasonable to use in the decision to withhold, postpone, or initiate statin therapy in intermediate-risk or selected borderline-risk asymptomatic adults (age 67-75 years and LDL-C >=70 to <190 mg/dL) who do not have diabetes or established atherosclerotic cardiovascular disease (ASCVD).* In intermediate-risk (10-year ASCVD risk >=7.5% to <20%) adults or selected borderline-risk  (10-year ASCVD risk >=5% to <7.5%) adults in whom a CAC score is measured for the purpose of making a treatment decision the following recommendations have been made:  If CAC = 0, it is reasonable to withhold statin therapy and reassess in 5 to 10 years, as long as higher risk conditions are absent (diabetes mellitus, family history of premature CHD in first degree relatives (males <55 years; females <65 years), cigarette smoking, LDL >=190 mg/dL or other independent risk factors).  If CAC is 1 to 99, it is reasonable to initiate statin therapy for patients >=35 years of age.  If CAC is >=100 or >=75th percentile, it is reasonable to initiate statin therapy at any age.       Assessment & Plan   1.  Chest pain-denies recent episodes of arm neck back or chest discomfort.  Coronary CTA showed calcium score of 192.  She was noted to have mild plaque in her proximal LAD. Start aspirin   Coronary artery disease, aortic atherosclerosis-noted to have mild amount of plaque in coronary CTA.  Details above.  Discontinue hormone replacement therapy Start aspirin  81 mg Heart healthy low-sodium diet Increase physical activity as tolerated  Hyperlipidemia-LDL 117 on 02/01/23. High-fiber diet Fasting lipids  Increase physical activity as tolerated  Mitral valve regurgitation-no increased shortness of breath.  PFTs  unremarkable.  Echocardiogram recently showed moderate mitral valve regurgitation.  It appears her symptoms are related to deconditioning and did not appear to be cardiac related. Increase physical activity as tolerated Repeat echocardiogram 5/26   Disposition: Follow-up with Dr. Micael Adas or me in 9-12 months.   Chet Cota. Linnea Todisco NP-C     02/05/2024, 4:03 PM [facility] [address] Office [phone] Fax [phone]    I spent 14 minutes examining this patient, reviewing medications, and using patient centered shared decision  making involving their cardiac care.   I spent  20 minutes reviewing past medical history,  medications, and prior cardiac tests.

## 2024-02-05 ENCOUNTER — Ambulatory Visit: Attending: General Practice | Admitting: General Practice

## 2024-02-05 ENCOUNTER — Encounter: Payer: Self-pay | Admitting: General Practice

## 2024-02-05 VITALS — BP 142/78 | HR 95 | Ht 66.5 in | Wt 187.0 lb

## 2024-02-05 DIAGNOSIS — I340 Nonrheumatic mitral (valve) insufficiency: Secondary | ICD-10-CM | POA: Insufficient documentation

## 2024-02-05 DIAGNOSIS — E782 Mixed hyperlipidemia: Secondary | ICD-10-CM | POA: Diagnosis present

## 2024-02-05 DIAGNOSIS — R079 Chest pain, unspecified: Secondary | ICD-10-CM | POA: Diagnosis present

## 2024-02-05 DIAGNOSIS — I2510 Atherosclerotic heart disease of native coronary artery without angina pectoris: Secondary | ICD-10-CM | POA: Diagnosis not present

## 2024-02-05 MED ORDER — ASPIRIN 81 MG PO TBEC
81.0000 mg | DELAYED_RELEASE_TABLET | Freq: Every day | ORAL | Status: AC
Start: 2024-02-05 — End: ?

## 2024-02-05 NOTE — Patient Instructions (Signed)
 Medication Instructions:  START ASPIRIN  81MG  DAILY(DO NOT USE THE CHEWABLE) *If you need a refill on your cardiac medications before your next appointment, please call your pharmacy*  Lab Work: FASTING LIPID AND LFT If you have labs (blood work) drawn today and your tests are completely normal, you will receive your results only by: MyChart Message (if you have MyChart) OR A paper copy in the mail If you have any lab test that is abnormal or we need to change your treatment, we will call you to review the results.  Testing/Procedures: NONE  Follow-Up: At [facility], you and your health needs are our priority.  As part of our continuing mission to provide you with exceptional heart care, our providers are all part of one team.  This team includes your primary Cardiologist (physician) and Advanced Practice Providers or APPs (Physician Assistants and Nurse Practitioners) who all work together to provide you with the care you need, when you need it.  Your next appointment:   9-12 month(s)  Provider:   Gaylyn Keas, MD    Other Instructions INCREASE PHYSICAL ACTIVITY AS TOLERATED PLEASE READ AND FOLLOW INCREASED FIBRE DIET-ATTACHED  High-Fiber Eating Plan Fiber, also called dietary fiber, is found in foods such as fruits, vegetables, whole grains, and beans. A high-fiber diet can be good for your health. Your health care provider may recommend a high-fiber diet to help: Prevent trouble pooping (constipation). Lower your cholesterol. Treat the following conditions: Hemorrhoids. This is inflammation of veins in the anus. Inflammation of specific areas of the digestive tract. Irritable bowel syndrome (IBS). This is a problem of the large intestine, also called the colon, that sometimes causes belly pain and bloating. Prevent overeating as part of a weight-loss plan. Lower the risk of heart disease, type 2 diabetes, and certain cancers. What are tips for following this  plan? Reading food labels  Check the nutrition facts label on foods for the amount of dietary fiber. Choose foods that have 4 grams of fiber or more per serving. The recommended goals for how much fiber you should eat each day include: Males 84 years old or younger: 30-34 g. Males over 83 years old: 28-34 g. Females 19 years old or younger: 25-28 g. Females over 32 years old: 22-25 g. Your daily fiber goal is _____________ g. Shopping Choose whole fruits and vegetables instead of processed. For example, choose apples instead of apple juice or applesauce. Choose a variety of high-fiber foods such as avocados, lentils, oats, and pinto beans. Read the nutrition facts label on foods. Check for foods with added fiber. These foods often have high sugar and salt (sodium) amounts per serving. Cooking Use whole-grain flour for baking and cooking. Cook with brown rice instead of white rice. Make meals that have a lot of beans and vegetables in them, such as chili or vegetable-based soups. Meal planning Start the day with a breakfast that is high in fiber, such as a cereal that has 5 g of fiber or more per serving. Eat breads and cereals that are made with whole-grain flour instead of refined flour or white flour. Eat brown rice, bulgur wheat, or millet instead of white rice. Use beans in place of meat in soups, salads, and pasta dishes. Be sure that half of the grains you eat each day are whole grains. General information You can get the recommended amount of dietary fiber by: Eating a variety of fruits, vegetables, grains, nuts, and beans. Taking a fiber supplement if you aren't  able to eat enough fiber. It's better to get fiber through food than from a supplement. Slowly increase how much fiber you eat. If you increase the amount of fiber you eat too quickly, you may have bloating, cramping, or gas. Drink plenty of water to help you digest fiber. Choose high-fiber snacks, such as berries, raw  vegetables, nuts, and popcorn. What foods should I eat? Fruits Berries. Pears. Apples. Oranges. Avocado. Prunes and raisins. Dried figs. Vegetables Sweet potatoes. Spinach. Kale. Artichokes. Cabbage. Broccoli. Cauliflower. Green peas. Carrots. Squash. Grains Whole-grain breads. Multigrain cereal. Oats and oatmeal. Brown rice. Barley. Bulgur wheat. Millet. Quinoa. Bran muffins. Popcorn. Rye wafer crackers. Meats and other proteins Navy beans, kidney beans, and pinto beans. Soybeans. Split peas. Lentils. Nuts and seeds. Dairy Fiber-fortified yogurt. Fortified means that fiber has been added to the product. Beverages Fiber-fortified soy milk. Fiber-fortified orange juice. Other foods Fiber bars. The items listed above may not be all the foods and drinks you can have. Talk to a dietitian to learn more. What foods should I avoid? Fruits Fruit juice. Cooked, strained fruit. Vegetables Fried potatoes. Canned vegetables. Well-cooked vegetables. Grains White bread. Pasta made with refined flour. White rice. Meats and other proteins Fatty meat. Fried chicken or fried fish. Dairy Milk. Cream cheese. Sour cream. Fats and oils Butters. Beverages Soft drinks. Other foods Cakes and pastries. The items listed above may not be all the foods and drinks you should avoid. Talk to a dietitian to learn more. This information is not intended to replace advice given to you by your health care provider. Make sure you discuss any questions you have with your health care provider. Document Revised: 11/28/2022 Document Reviewed: 11/28/2022 Elsevier Patient Education  2024 [employer].

## 2024-02-05 NOTE — Telephone Encounter (Signed)
 Call to patient to discuss echo results. No up to date DPR on file, no answer. Left message with no recipient identifiers asking recipient to call [place] at our office #.

## 2024-02-05 NOTE — Telephone Encounter (Signed)
-----   Message from Gaylyn Keas sent at 01/31/2024  9:03 PM EDT ----- Normal heart function EF 60-65% with increased stiffness of heart muscle called diastolic dysfunction seen with aging, moderate leakiness of mitral valve, mild to moderate leakiness of aortic valve.  Repeat echo in 1 year for MR

## 2024-02-09 ENCOUNTER — Encounter: Payer: Self-pay | Admitting: Cardiology

## 2024-02-09 ENCOUNTER — Encounter: Admitting: Adult Health

## 2024-02-17 NOTE — Progress Notes (Signed)
 This encounter was created in error - please disregard.

## 2024-02-19 ENCOUNTER — Encounter: Admitting: Family

## 2024-02-19 NOTE — Progress Notes (Signed)
   This encounter was created in error - please disregard. No show

## 2024-02-24 ENCOUNTER — Ambulatory Visit: Payer: Self-pay | Admitting: Cardiology

## 2024-02-27 ENCOUNTER — Encounter: Admitting: Nurse Practitioner

## 2024-02-27 NOTE — Progress Notes (Deleted)
   Elaine Gray 1956-03-18 [id number]   History:  68 y.o. G2P0020 presents for breast and pelvic exam. Postmenopausal - on ERT. Using vaginal estrogen. S/P 1996 LAVH BSO for endometriosis. Normal pap and mammogram history. Hypothyroidism managed by PCP.   Gynecologic History No LMP recorded. Patient has had a hysterectomy.   Contraception/Family planning: status post hysterectomy Sexually active: Yes  Health Maintenance Last Pap: 07/20/2017. Results were: Normal Last mammogram: 10/07/2021. Results were: Normal Last colonoscopy: 05/2020. Results were: Normal, 5-year recall Last Dexa: 04/19/2022. Results were: Normal  Past medical history, past surgical history, family history and social history were all reviewed and documented in the EPIC chart. Married. Works in Community education officer, remote.   ROS:  A ROS was performed and pertinent positives and negatives are included.  Exam:  There were no vitals filed for this visit.   There is no height or weight on file to calculate BMI.  General appearance:  Normal Thyroid :  Symmetrical, normal in size, without palpable masses or nodularity. Respiratory  Auscultation:  Clear without wheezing or rhonchi Cardiovascular  Auscultation:  Regular rate, without rubs, murmurs or gallops  Edema/varicosities:  Not grossly evident Abdominal  Soft,nontender, without masses, guarding or rebound.  Liver/spleen:  No organomegaly noted  Hernia:  None appreciated  Skin  Inspection:  Grossly normal   Breasts: Examined lying and sitting.   Right: Without masses, retractions, discharge or axillary adenopathy.   Left: Without masses, retractions, discharge or axillary adenopathy. Pelvic: External genitalia:  no lesions              Urethra:  normal appearing urethra with no masses, tenderness or lesions              Bartholins and Skenes: normal                 Vagina: normal appearing vagina with normal color and discharge, no lesions              Cervix:  absent Bimanual Exam:  Uterus:  absent              Adnexa: no mass, fullness, tenderness              Rectovaginal: Deferred              Anus:  normal, no lesions  Arvell Birchwood, CMA present as chaperone.   Assessment/Plan:  68 y.o. G2P0020 for annual exam.   Well female exam with routine gynecological exam - Education provided on SBEs, importance of preventative screenings, current guidelines, high calcium diet, regular exercise, and multivitamin daily.  Labs with PCP.   Postmenopausal hormone therapy - Plan: estradiol  (CLIMARA  - DOSED IN MG/24 HR) 0.05 mg/24hr patch weekly. Was late changing patch recently and experienced severe fatigue. She is aware of the risks for blood clots, heart attack, stroke, and breast cancer. She would like to continue. Refill x 1 year provided.   Postmenopausal - On ERT. S/P LAVH BSO  Screening for cervical cancer - Normal Pap history.  No longer screening per guidelines.   Screening for breast cancer - Normal mammogram history. Prefers screenings every 2 years. Normal breast exam today.  Screening for colon cancer - 2021 colonoscopy. Will repeat at GI's recommended interval.   Screening for osteoporosis - Normal bone density last year.   No follow-ups on file.     Andee Bamberger [facility], 2:43 PM 02/27/2024

## 2024-03-01 ENCOUNTER — Telehealth: Payer: Self-pay

## 2024-03-01 NOTE — Telephone Encounter (Signed)
 Call to patient as no labs have resulted from order placed 02/05/24. No answer, left message with no identifiers asking recipient to call [place] at our office # as DPR is expired.

## 2024-03-20 ENCOUNTER — Other Ambulatory Visit: Payer: Self-pay | Admitting: Nurse Practitioner

## 2024-03-20 DIAGNOSIS — Z79890 Hormone replacement therapy (postmenopausal): Secondary | ICD-10-CM

## 2024-03-20 NOTE — Telephone Encounter (Signed)
 Med refill request: Cl[place]ara  Last AEX: 11/23/22 TW Next AEX: 04/11/24 TW Last MMG (if hormonal med) 10/07/21 Refill authorized: Last Rx sent #12 patch with zero refills. Please approve or deny.

## 2024-03-21 ENCOUNTER — Other Ambulatory Visit: Payer: Self-pay

## 2024-03-21 DIAGNOSIS — Z79899 Other long term (current) drug therapy: Secondary | ICD-10-CM

## 2024-03-21 DIAGNOSIS — I2510 Atherosclerotic heart disease of native coronary artery without angina pectoris: Secondary | ICD-10-CM

## 2024-04-11 ENCOUNTER — Encounter: Payer: Self-pay | Admitting: Nurse Practitioner

## 2024-04-11 ENCOUNTER — Ambulatory Visit (INDEPENDENT_AMBULATORY_CARE_PROVIDER_SITE_OTHER): Admitting: Nurse Practitioner

## 2024-04-11 VITALS — BP 110/70 | HR 84 | Ht 66.5 in | Wt 180.0 lb

## 2024-04-11 DIAGNOSIS — R8290 Unspecified abnormal findings in urine: Secondary | ICD-10-CM

## 2024-04-11 DIAGNOSIS — Z79890 Hormone replacement therapy (postmenopausal): Secondary | ICD-10-CM

## 2024-04-11 DIAGNOSIS — Z9189 Other specified personal risk factors, not elsewhere classified: Secondary | ICD-10-CM

## 2024-04-11 DIAGNOSIS — Z01419 Encounter for gynecological examination (general) (routine) without abnormal findings: Secondary | ICD-10-CM

## 2024-04-11 LAB — URINALYSIS, COMPLETE W/RFL CULTURE
Bacteria, UA: NONE SEEN /HPF
Bilirubin Urine: NEGATIVE
Glucose, UA: NEGATIVE
Hgb urine dipstick: NEGATIVE
Hyaline Cast: NONE SEEN /LPF
Leukocyte Esterase: NEGATIVE
Nitrites, Initial: NEGATIVE
Protein, ur: NEGATIVE
RBC / HPF: NONE SEEN /HPF (ref 0–2)
Specific Gravity, Urine: 1.02 (ref 1.001–1.035)
WBC, UA: NONE SEEN /HPF (ref 0–5)
pH: 5.5 (ref 5.0–8.0)

## 2024-04-11 LAB — NO CULTURE INDICATED

## 2024-04-11 MED ORDER — ESTRADIOL 0.0375 MG/24HR TD PTWK: 0.0375 mg | MEDICATED_PATCH | TRANSDERMAL | 4 refills | Status: AC

## 2024-04-11 NOTE — Patient Instructions (Signed)

## 2024-04-11 NOTE — Progress Notes (Signed)
 Elaine Gray 1955/12/14 [id number]   History:  68 y.o. G2P0020 presents for breast and pelvic exam. Complains of urine odor. Denies burning, urgency, frequency or hematuria. Postmenopausal - on ERT. S/P 1996 LAVH BSO for endometriosis. Normal pap history. Hypothyroidism managed by PCP.   Gynecologic History No LMP recorded. Patient has had a hysterectomy.   Contraception/Family planning: status post hysterectomy Sexually active: Yes  Health Maintenance Last Pap: 07/20/2017. Results were: Normal Last mammogram: 10/2023. Results were: Normal per patient Last colonoscopy: 05/2020. Results were: Normal, 5-year recall Last Dexa: 04/19/2022. Results were: Normal  Past medical history, past surgical history, family history and social history were all reviewed and documented in the EPIC chart. Married. Works in Community education officer, remote.   ROS:  A ROS was performed and pertinent positives and negatives are included.  Exam:  Vitals:   04/11/24 1442  BP: 110/70  Pulse: 84  SpO2: 98%  Weight: 180 lb (81.6 kg)  Height: 5' 6.5 (1.689 m)     Body mass index is 28.62 kg/m.  General appearance:  Normal Thyroid :  Symmetrical, normal in size, without palpable masses or nodularity. Respiratory  Auscultation:  Clear without wheezing or rhonchi Cardiovascular  Auscultation:  Regular rate, without rubs, murmurs or gallops  Edema/varicosities:  Not grossly evident Abdominal  Soft,nontender, without masses, guarding or rebound.  Liver/spleen:  No organomegaly noted  Hernia:  None appreciated  Skin  Inspection:  Grossly normal   Breasts: Examined lying and sitting.   Right: Without masses, retractions, discharge or axillary adenopathy.   Left: Without masses, retractions, discharge or axillary adenopathy. Pelvic: External genitalia:  no lesions              Urethra:  normal appearing urethra with no masses, tenderness or lesions              Bartholins and Skenes: normal                  Vagina: normal appearing vagina with normal color and discharge, no lesions              Cervix: absent Bimanual Exam:  Uterus: absent              Adnexa: no mass, fullness, tenderness              Rectovaginal: Deferred              Anus:  normal, no lesions  Dereck Keas, CMA present as chaperone.   UA negative  Assessment/Plan:  68 y.o. G2P0020 for annual exam.   Encounter for breast and pelvic examination - Education provided on SBEs, importance of preventative screenings, current guidelines, high calcium diet, regular exercise, and multivitamin daily. Planning to establish with PCP.   Postmenopausal hormone therapy - Plan: estradiol  (CLIMARA  - DOSED IN MG/24 HR) 0.0375 mg/24hr patch weekly. She is aware of the risks for blood clots, heart attack, stroke, and breast cancer. She would like to continue. Refill x 1 year provided.   Abnormal urine odor - Plan: Urinalysis,Complete w/RFL Culture. Neg UA.   Postmenopausal - On ERT. S/P LAVH BSO  Screening for cervical cancer - Normal Pap history.  No longer screening per guidelines.   Screening for breast cancer - Normal mammogram history. Prefers screenings every 2 years. Normal breast exam today.  Screening for colon cancer - 2021 colonoscopy. Will repeat at GI's recommended interval.   Screening for osteoporosis - Normal bone density 2023.  Return in about 1 year (  around 04/11/2025) for B&P (high risk).     Elaine Gray [facility], 3:12 PM 04/11/2024

## 2024-04-18 ENCOUNTER — Other Ambulatory Visit: Payer: Self-pay

## 2024-04-18 DIAGNOSIS — E782 Mixed hyperlipidemia: Secondary | ICD-10-CM

## 2024-04-18 DIAGNOSIS — Z79899 Other long term (current) drug therapy: Secondary | ICD-10-CM

## 2024-04-18 DIAGNOSIS — I2510 Atherosclerotic heart disease of native coronary artery without angina pectoris: Secondary | ICD-10-CM

## 2024-04-18 NOTE — Progress Notes (Signed)
 Labs re-ordered

## 2024-04-24 ENCOUNTER — Ambulatory Visit: Payer: Self-pay | Admitting: Cardiology

## 2024-04-24 LAB — LIPID PANEL
Chol/HDL Ratio: 2.9 ratio (ref 0.0–4.4)
Cholesterol, Total: 194 mg/dL (ref 100–199)
HDL: 68 mg/dL (ref >39)
LDL Chol Calc (NIH): 109 mg/dL — ABNORMAL HIGH (ref 0–99)
Triglycerides: 93 mg/dL (ref 0–149)
VLDL Cholesterol Cal: 17 mg/dL (ref 5–40)

## 2024-04-24 LAB — ALT: ALT: 22 IU/L (ref 0–32)

## 2024-04-24 NOTE — Telephone Encounter (Signed)
 Call to patient to advise LDL too high per Dr. Shlomo. Discussed Dr. Shlomo recommends to start Crestor 10mg  daily and repeat FLP and ALT in 6 weeks. Patient vehemently refuses this option. Offered appointment with Dr. Shlomo to discuss other medications/strategies, patient declines this also. She states she will talk to the physicians in her family and see what they recommend.   Also discussed Dr. Dorine recommendation that she talk with her PCP/GYN about getting off the estrogen patch. Patient said she recently decreased her dose on 04/11/24 and she will continue to transition to gradually lower doses but she emphatically declines to make any further changes at this time.

## 2024-04-24 NOTE — Telephone Encounter (Signed)
-----   Message from Wilbert Bihari sent at 04/24/2024  8:07 AM EDT ----- LDL too high - please start Crestor 10mg  daily and repeat FLP and ALT in 6 weeks.  ALso given her CAD she needs to talk with her PCP/GYN about getting off the estrogen patch ----- Message ----- From: Interface, [facility] Lab Results In Sent: 04/24/2024  12:35 AM EDT To: Wilbert JONELLE Bihari, MD

## 2024-05-03 ENCOUNTER — Encounter (HOSPITAL_BASED_OUTPATIENT_CLINIC_OR_DEPARTMENT_OTHER): Payer: Self-pay

## 2024-05-03 ENCOUNTER — Other Ambulatory Visit: Payer: Self-pay

## 2024-05-03 DIAGNOSIS — Y92810 Car as the place of occurrence of the external cause: Secondary | ICD-10-CM | POA: Insufficient documentation

## 2024-05-03 DIAGNOSIS — W268XXA Contact with other sharp object(s), not elsewhere classified, initial encounter: Secondary | ICD-10-CM | POA: Insufficient documentation

## 2024-05-03 DIAGNOSIS — S81811A Laceration without foreign body, right lower leg, initial encounter: Secondary | ICD-10-CM | POA: Insufficient documentation

## 2024-05-03 DIAGNOSIS — Z7982 Long term (current) use of aspirin: Secondary | ICD-10-CM | POA: Insufficient documentation

## 2024-05-03 DIAGNOSIS — S8991XA Unspecified injury of right lower leg, initial encounter: Secondary | ICD-10-CM | POA: Diagnosis present

## 2024-05-03 NOTE — ED Triage Notes (Signed)
 Pt reports laceration to R leg yesterday from car door. Pt went to UC today and reports a possible varicose vein was hit and wound is squriting. Wound is covered with coban at this time. Pt ambulatory.

## 2024-05-04 ENCOUNTER — Emergency Department (HOSPITAL_BASED_OUTPATIENT_CLINIC_OR_DEPARTMENT_OTHER)
Admission: EM | Admit: 2024-05-04 | Discharge: 2024-05-04 | Disposition: A | Discharge status: Home / Self Care | Source: Ambulatory Visit | Attending: Emergency Medicine | Admitting: Emergency Medicine

## 2024-05-04 DIAGNOSIS — S81811A Laceration without foreign body, right lower leg, initial encounter: Secondary | ICD-10-CM | POA: Diagnosis not present

## 2024-05-04 MED ORDER — MUPIROCIN 2 % EX OINT: TOPICAL_OINTMENT | Freq: Two times a day (BID) | CUTANEOUS | 2 refills | Status: AC

## 2024-05-04 MED ORDER — DOXYCYCLINE HYCLATE 100 MG PO CAPS: 100.0000 mg | ORAL_CAPSULE | Freq: Two times a day (BID) | ORAL | 0 refills | Status: AC

## 2024-05-04 MED ORDER — LIDOCAINE-EPINEPHRINE (PF) 2 %-1:200000 IJ SOLN
10.0000 mL | Freq: Once | INTRAMUSCULAR | Status: AC
  Administered 2024-05-04: 10 mL
  Filled 2024-05-04: qty 20
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
## 2024-05-04 NOTE — ED Provider Notes (Signed)
 [place] EMERGENCY DEPARTMENT AT [facility] Provider Note   CSN: [id number] Arrival date & time: 05/03/24  1843     Patient presents with: Laceration   Elaine Gray is a 68 y.o. female.   Patient presents to the emergency department for evaluation of injury to right lower leg.  Patient reports that she had a car door swinging onto her leg yesterday and a laceration to the shin area.  There was a lot of bleeding.  She went to urgent care today and the wound was still bleeding, was referred to the emergency department.       Prior to Admission medications   Medication Sig Start Date End Date Taking? Authorizing Provider  doxycycline (VIBRAMYCIN) 100 MG capsule Take 1 capsule (100 mg total) by mouth 2 (two) times daily. 05/04/24  Yes Besse Miron, Lonni PARAS, MD  mupirocin ointment (BACTROBAN) 2 % Apply topically 2 (two) times daily. 05/04/24  Yes Jaidee Stipe, Lonni PARAS, MD  Alpha-D-Galactosidase (ECK FOOD ENZYME PO) Take 1 capsule by mouth every evening. Food Enzyme Compound    [provider]  AMINO ACIDS PO Take 1 capsule by mouth daily.     [provider]  amphetamine-dextroamphetamine (ADDERALL XR) 25 MG 24 hr capsule Take 25 mg by mouth daily. 09/07/16   [provider]  aspirin EC 81 MG tablet Take 1 tablet (81 mg total) by mouth daily. Swallow whole. 02/05/24   Emelia Josefa HERO, NP  B Complex Vitamins (VITAMIN B COMPLEX PO) Take 1 capsule by mouth daily.     [provider]  B Complex-Biotin-FA (SUPER QUINTS B-50) TABS Take by mouth. 12/14/17   [provider]  Boswellia Serrata (BOSWELLIA PO) Take 1 capsule by mouth daily as needed (for arthritis pain).    [provider]  budesonide-formoterol [place]) 160-4.5 MCG/ACT inhaler  10/11/23   [provider]  Cholecalciferol (VITAMIN D) 2000 units CAPS Take 2,000 Units by mouth See admin instructions. (2-3x's weekly)    [provider]  Coenzyme  Q10 (COQ10) 100 MG CAPS Take 100 mg by mouth daily.     [provider]  COMBIGAN 0.2-0.5 % ophthalmic solution Place 1 drop into the left eye 2 (two) times daily. 09/08/16   [provider]  CREATINE PO Take 250 mg by mouth See admin instructions. (2-3x's weekly)    [provider]  Cyanocobalamin (VITAMIN B-12 CR) 1000 MCG TBCR Take 1,000 mcg by mouth daily.    [provider]  dorzolamide-timolol (COSOPT) 22.3-6.8 MG/ML ophthalmic solution INSTILL 1 DROP INTO RIGHT EYE DAILY AND 1 DROP INTO LEFT EYE TWICE DAILY 05/18/21   [provider]  estradiol (CLIMARA - DOSED IN MG/24 HR) 0.0375 mg/24hr patch Place 1 patch (0.0375 mg total) onto the skin once a week. 04/11/24   Prentiss Riggs A, NP  latanoprost (XALATAN) 0.005 % ophthalmic solution Place 1 drop into both eyes at bedtime.     [provider]  levothyroxine (SYNTHROID) 88 MCG tablet Take 88 mcg by mouth daily at 6 (six) AM. 01/19/21   [provider]  liothyronine (CYTOMEL) 5 MCG tablet Take 5 mcg by mouth daily. 01/19/21   [provider]  magnesium oxide (MAG-OX) 400 MG tablet Take 400 mg by mouth every evening.    [provider]  Nutritional Supplements (DHEA PO) Take 100 mg by mouth daily.    [provider]  Omega 3-6-9 Fatty Acids (OMEGA-3-6-9 PO) Take 1 capsule by mouth See admin instructions. (  3-4x's weekly)    [provider]  Probiotic Product (PROBIOTIC DAILY PO) Take 1 capsule by mouth every evening. Probiotic 50 billion Compound    [provider]  Quercetin 250 MG TABS Take 250 mg by mouth See admin instructions. (2-3x's weekly)    [provider]  sodium chloride (OCEAN) 0.65 % SOLN nasal spray Place 1 spray into both nostrils at bedtime.    [provider]    Allergies: Codeine, Amoxicillin-pot clavulanate, Other, Sulfacetamide sodium, and Sulfonamide derivatives    Review of Systems  Updated Vital  Signs BP (!) 167/82   Pulse (!) 105   Temp 97.7 F (36.5 C) (Oral)   Resp 20   Ht 5' 6 (1.676 m)   Wt 78.9 kg   SpO2 100%   BMI 28.08 kg/m   Physical Exam Vitals and nursing note reviewed.  Constitutional:      Appearance: Normal appearance.  HENT:     Head: Atraumatic.  Cardiovascular:     Pulses:          Dorsalis pedis pulses are 2+ on the right side.  Skin:    Findings: Laceration (Right lateral lower leg) present.         Comments: Bilateral lower extremities with some sequela of chronic swelling  Neurological:     Mental Status: She is alert.     Cranial Nerves: Cranial nerves 2-12 are intact.     Sensory: Sensation is intact.     Motor: Motor function is intact.     (all labs ordered are listed, but only abnormal results are displayed) Labs Reviewed - No data to display  EKG: None  Radiology: No results found.   .Laceration Repair  Date/Time: 05/04/2024 5:29 AM  Performed by: Haze Lonni PARAS, MD Authorized by: Haze Lonni PARAS, MD   Consent:    Consent obtained:  Verbal   Consent given by:  Patient   Risks, benefits, and alternatives were discussed: yes     Risks discussed:  Infection, pain and poor cosmetic result Universal protocol:    Procedure explained and questions answered to patient or proxy's satisfaction: yes     Required blood products, implants, devices, and special equipment available: yes     Site/side marked: yes     Immediately prior to procedure, a time out was called: yes     Patient identity confirmed:  Verbally with patient Anesthesia:    Anesthesia method:  Local infiltration   Local anesthetic:  Lidocaine 2% WITH epi Laceration details:    Location:  Leg   Leg location:  R lower leg   Length (cm):  4 Pre-procedure details:    Preparation:  Patient was prepped and draped in usual sterile fashion Exploration:    Hemostasis achieved with:  Epinephrine and direct pressure   Contaminated: no   Treatment:     Area cleansed with:  Povidone-iodine   Amount of cleaning:  Standard   Irrigation solution:  Sterile saline   Irrigation volume:  500   Irrigation method:  Pressure wash   Debridement:  Moderate   Undermining:  Minimal   Layers/structures repaired:  Deep subcutaneous Deep subcutaneous:    Suture size:  4-0   Suture material:  Vicryl   Suture technique:  Buried horizontal mattress   Number of sutures:  2 Skin repair:    Repair method:  Sutures   Suture size:  3-0   Suture material:  Prolene   Number of sutures: 2 horizontal  mattress; 3 simple interrupted. Approximation:    Approximation:  Close Repair type:    Repair type:  Intermediate Post-procedure details:    Dressing:  Non-adherent dressing   Procedure completion:  Tolerated well, no immediate complications    Medications Ordered in the ED  lidocaine-EPINEPHrine (XYLOCAINE W/EPI) 2 %-1:200000 (PF) injection 10 mL (10 mLs Infiltration Given 05/04/24 0407)                                    Medical Decision Making Risk Prescription drug management.   Presents with a complex laceration to right lower leg.  There is a fair amount of missing tissue.  There was a deep laceration through the center of the avulsed area.  The area was undermined and then brought together with subcutaneous horizontal mattress sutures.  The area was then further approximated with horizontal mattress sutures through the skin.  Interrupted sutures were then placed around the horizontal mattress sutures.  There was extensive cleaning performed.  As the wound is 24 hours old, we will place on antibiotics.  Even wound care instructions.     Final diagnoses:  Leg laceration, right, initial encounter    ED Discharge Orders          Ordered    mupirocin ointment (BACTROBAN) 2 %  2 times daily        05/04/24 0534    doxycycline (VIBRAMYCIN) 100 MG capsule  2 times daily        05/04/24 0534               Haze Lonni PARAS,  MD 05/04/24 [phone]

## 2024-05-04 NOTE — Discharge Instructions (Signed)
 Keep the area clean, dry, covered.  Elevate the leg is much as possible.  Apply pressure if there is minor bleeding.  Stitches will need to come out in 10 days.

## 2024-05-14 NOTE — Progress Notes (Signed)
 Subjective Patient ID: Elaine Gray is a 68 y.o. female.  Chief Complaint  Patient presents with  . Suture / Staple Removal    Patient is here for removal of sutures in the lower right leg that were placed on 05/03/2024.    The following information was reviewed by members of the visit team:  Tobacco  Allergies  Meds  Problems  Med Hx  Surg Hx  OB Status   Fam Hx  Soc Hx     Pt is a 68 yo here for suture removal; had lac repair 10 days ago was hit with her door. Area is red, mild drainage present. Leg with swelling as well present bilat - states she did have sodium last night which causes her to swell. Denies fever or other pertinent symptoms, stable and in NAD    Review of Systems  Skin:  Positive for color change and wound.  All other systems reviewed and are negative.   Objective Physical Exam Vitals and nursing note reviewed.  Constitutional:      General: She is not in acute distress.    Appearance: She is normal weight.  HENT:     Head: Normocephalic.     Nose: Nose normal.     Mouth/Throat:     Mouth: Mucous membranes are moist.  Eyes:     Extraocular Movements: Extraocular movements intact.     Pupils: Pupils are equal, round, and reactive to light.  Pulmonary:     Effort: Pulmonary effort is normal.  Musculoskeletal:        General: Normal range of motion.  Skin:    General: Skin is warm and dry.     Capillary Refill: Capillary refill takes less than 2 seconds.     Findings: Erythema present.         Comments: + WOUND RIGHT LOWER LEG  Neurological:     General: No focal deficit present.     Mental Status: She is alert and oriented to person, place, and time.  Psychiatric:        Mood and Affect: Mood normal.        Behavior: Behavior normal.    Suture removal  Date/Time: 05/14/2024 6:10 PM  Performed by: Rexene Charlies Pouch, NP Authorized by: Rexene Charlies Pouch, NP  Procedure: Location details: right lower leg Body area: lower  extremity Wound Appearance: red, warm, tender and draining  Suture Type: sutures Sutures Removed: 5 Post Procedure:  Post-removal: dressing applied Patient tolerance: patient tolerated the procedure well with no immediate complications     Assessment/Plan Diagnoses and all orders for this visit:  Cellulitis of right lower leg -     Anaerobic Culture -     Aerobic Culture; Future -     Aerobic Culture -     clindamycin (CLEOCIN) 300 mg capsule; Take 1 capsule (300 mg total) by mouth 4 (four) times a day for 10 days.  Wound infection -     Anaerobic Culture -     Aerobic Culture; Future -     Aerobic Culture  Other orders -     Suture removal     DDX: Cellulitis, abscess, hidradenitis, staph infection, folliculitis, dermatitis,   MDM: Pt is a 68 yo here for right lower leg erythema, swelling, also suture removal erythema, swelling, also suture removal - sutures removed, she has been on doxy for last ten days however cellulitis present; culture sent of area, will start clindamyacin, discussed with pt and she is agreeable. Discussed wound care  treatment importance of antibiotic compliance, as well as follow up, advised to see PCP in 3-4 days for reassessment,  advised on red flags warranting ER evaluation, pt verbalized understanding of all instructions, agreeable to plan of care, stable at departure.   Electronically signed: Rexene Charlies Pouch, NP 05/14/2024  8:51 PM

## 2024-07-03 ENCOUNTER — Encounter (HOSPITAL_BASED_OUTPATIENT_CLINIC_OR_DEPARTMENT_OTHER): Attending: General Surgery | Admitting: General Surgery

## 2024-07-03 DIAGNOSIS — R600 Localized edema: Secondary | ICD-10-CM | POA: Diagnosis not present

## 2024-07-03 DIAGNOSIS — L97812 Non-pressure chronic ulcer of other part of right lower leg with fat layer exposed: Secondary | ICD-10-CM | POA: Diagnosis present

## 2024-07-10 ENCOUNTER — Encounter (HOSPITAL_BASED_OUTPATIENT_CLINIC_OR_DEPARTMENT_OTHER): Admitting: General Surgery

## 2024-07-10 DIAGNOSIS — L97812 Non-pressure chronic ulcer of other part of right lower leg with fat layer exposed: Secondary | ICD-10-CM | POA: Diagnosis not present

## 2024-07-17 ENCOUNTER — Encounter (HOSPITAL_BASED_OUTPATIENT_CLINIC_OR_DEPARTMENT_OTHER): Admitting: General Surgery

## 2024-07-17 DIAGNOSIS — L97812 Non-pressure chronic ulcer of other part of right lower leg with fat layer exposed: Secondary | ICD-10-CM | POA: Diagnosis not present

## 2024-07-24 ENCOUNTER — Encounter (HOSPITAL_BASED_OUTPATIENT_CLINIC_OR_DEPARTMENT_OTHER): Attending: General Surgery | Admitting: General Surgery

## 2024-07-24 DIAGNOSIS — R600 Localized edema: Secondary | ICD-10-CM | POA: Insufficient documentation

## 2024-07-24 DIAGNOSIS — L97812 Non-pressure chronic ulcer of other part of right lower leg with fat layer exposed: Secondary | ICD-10-CM | POA: Insufficient documentation

## 2024-08-01 ENCOUNTER — Encounter (HOSPITAL_BASED_OUTPATIENT_CLINIC_OR_DEPARTMENT_OTHER): Admitting: General Surgery

## 2024-08-01 DIAGNOSIS — L97812 Non-pressure chronic ulcer of other part of right lower leg with fat layer exposed: Secondary | ICD-10-CM | POA: Diagnosis not present

## 2024-08-07 ENCOUNTER — Encounter (HOSPITAL_BASED_OUTPATIENT_CLINIC_OR_DEPARTMENT_OTHER): Admitting: General Surgery

## 2024-08-13 ENCOUNTER — Encounter (HOSPITAL_BASED_OUTPATIENT_CLINIC_OR_DEPARTMENT_OTHER): Admitting: General Surgery

## 2024-08-13 DIAGNOSIS — L97812 Non-pressure chronic ulcer of other part of right lower leg with fat layer exposed: Secondary | ICD-10-CM | POA: Diagnosis not present

## 2024-08-26 ENCOUNTER — Encounter (HOSPITAL_BASED_OUTPATIENT_CLINIC_OR_DEPARTMENT_OTHER): Admitting: Internal Medicine

## 2024-10-08 ENCOUNTER — Encounter: Payer: Self-pay | Admitting: Nurse Practitioner
